# Patient Record
Sex: Male | Born: 1955 | Race: White | Hispanic: No | Marital: Single | Smoking: Never smoker
Health system: Northeastern US, Academic
[De-identification: ages and names within clinical notes are randomized; demographics above are authoritative.]

---

## 2016-03-03 HISTORY — PX: TRANSURETHRAL RESECTION OF PROSTATE: SHX73

## 2016-09-25 LAB — HEMOGLOBIN A1C: HEMOGLOBIN A1C % (INT/EXT): 6.2 % — ABNORMAL HIGH (ref 4.6–5.6)

## 2018-01-20 ENCOUNTER — Ambulatory Visit: Admitting: Urology

## 2018-01-21 LAB — PSA SCREENING (EXT): PSA (EXT): 2.6 ng/mL (ref 0.0–5.0)

## 2018-01-21 LAB — HEMOGLOBIN A1C: HEMOGLOBIN A1C % (INT/EXT): 6.6 % — ABNORMAL HIGH (ref 4.6–5.6)

## 2018-11-01 ENCOUNTER — Inpatient Hospital Stay
Admit: 2018-11-01 | Disposition: A | Discharge status: Home / Self Care | Source: Home / Self Care | Attending: Emergency Medicine | Admitting: Emergency Medicine

## 2018-11-01 ENCOUNTER — Ambulatory Visit: Admitting: Urology

## 2018-11-01 LAB — HX COMPREHENSIVE METABOLIC PANEL
CASE NUMBER: 2020244000209
HX ALBUMIN LVL: 4.2 g/dL — NL (ref 3.2–5.0)
HX ALKALINE PHOSPHATASE: 65 U/L — NL (ref 30.0–117.0)
HX ALT: 26 U/L — NL (ref 6.0–55.0)
HX ANION GAP: 6 — NL (ref 3.0–11.0)
HX AST: 46 U/L — ABNORMAL HIGH (ref 6.0–40.0)
HX BILIRUBIN TOTAL: 0.7 mg/dL — NL (ref 0.2–1.2)
HX BUN: 18 mg/dL — NL (ref 8.0–23.0)
HX CALCIUM LVL: 9 mg/dL — NL (ref 8.5–10.5)
HX CHLORIDE: 106 mmol/L — NL (ref 98.0–110.0)
HX CO2: 24 mmol/L — NL (ref 21.0–32.0)
HX CREATININE: 1.17 mg/dL — NL (ref 0.55–1.3)
HX GLUCOSE LVL: 165 mg/dL — ABNORMAL HIGH (ref 70.0–110.0)
HX POTASSIUM LVL: 5 mmol/L — NL (ref 3.6–5.2)
HX SODIUM LVL: 136 mmol/L — NL (ref 136.0–146.0)
HX TOTAL PROTEIN: 8.2 g/dL — NL (ref 6.0–8.4)

## 2018-11-01 LAB — HX .ABORH CONFIRMATION
CASE NUMBER: 2020244000209
HX ABORH CONFIRM: B POS — NL
HX ANTI-A: 0 — NL

## 2018-11-01 LAB — HX URINE DIPSTICK W/REFLEX
CASE NUMBER: 2020244000255
HX UA BILIRUBIN: NEGATIVE — NL
HX UA GLUCOSE: NEGATIVE — NL
HX UA KETONES: NEGATIVE — NL
HX UA LEUKOCYTE ESTERASE: NEGATIVE — NL
HX UA NITRITE: POSITIVE — AB
HX UA PH: 6 — NL (ref 5.0–8.0)
HX UA PROTEIN: 100 mg/dL — AB
HX UA RBC: >182 — ABNORMAL HIGH (ref 0.0–2.0)
HX UA SPECIFIC GRAVITY: >1.03 — ABNORMAL HIGH (ref 1.003–1.03)
HX UA SQUAMOUS EPITHELIAL: <1 — NL (ref 0.0–5.0)
HX UA UROBILINOGEN: 2 — AB
HX UA WBC: 8 /HPF — ABNORMAL HIGH (ref 0.0–5.0)

## 2018-11-01 LAB — HX CBC W/ DIFF
CASE NUMBER: 2020244000209
HX ABSOLUTE NRBC COUNT: 0 10*3/uL
HX HCT: 39.8 % — NL (ref 39.0–53.0)
HX HGB: 13.7 g/dL — NL (ref 13.0–17.5)
HX MCH: 29.9 pg — NL (ref 26.0–34.0)
HX MCHC: 34.4 g/dL — NL (ref 31.0–37.0)
HX MCV: 86.9 fL — NL (ref 80.0–100.0)
HX MPV: 11.3 fL — NL (ref 9.4–12.4)
HX NRBC PERCENT: 0 % — NL
HX PLATELET: 187 10*3/uL — NL (ref 150.0–400.0)
HX RBC: 4.58 10*6/uL — NL (ref 4.2–5.9)
HX RDW-CV: 13.3 % — NL (ref 11.5–14.5)
HX RDW-SD: 41.9 fL — NL (ref 35.0–51.0)
HX WBC: 10.4 10*3/uL — NL (ref 4.0–11.0)

## 2018-11-01 LAB — HX .AUTOMATED DIFF
CASE NUMBER: 2020244000209
HX ABSOLUTE BASO COUNT: 0.05 10*3/uL — NL (ref 0.0–0.22)
HX ABSOLUTE EOS COUNT: 0.04 10*3/uL — NL (ref 0.0–0.45)
HX ABSOLUTE LYMPHS COUNT: 1.15 10*3/uL — NL (ref 0.74–5.04)
HX ABSOLUTE MONO COUNT: 0.45 10*3/uL — NL (ref 0.0–1.34)
HX ABSOLUTE NEUTRO COUNT: 8.71 10*3/uL — ABNORMAL HIGH (ref 1.48–7.95)
HX BASOPHILS: 0.5 %
HX EOSINOPHILS: 0.4 %
HX IMMATURE GRANULOCYTES: 0.4 % — NL (ref 0.0–2.0)
HX LYMPHOCYTES: 11 %
HX MONOCYTES: 4.3 %
HX NEUTROPHILS: 83.4 %

## 2018-11-01 LAB — HX COVID19 BY PCR (LGH)
CASE NUMBER: 2020244000391
HX COVID19 BY PCR: NOT DETECTED

## 2018-11-01 LAB — HX GLOMERULAR FILTRATION RATE (ESTIMATED)
CASE NUMBER: 2020244000209
HX AFN AMER GLOMERULAR FILTRATION RATE: 75 mL/min/{1.73_m2}
HX NON-AFN AMER GLOMERULAR FILTRATION RATE: 65 mL/min/{1.73_m2}

## 2018-11-01 LAB — HX PTT
CASE NUMBER: 2020244000209
HX APTT: 23 s — NL (ref 23.0–32.0)

## 2018-11-01 LAB — HX PT
CASE NUMBER: 2020244000209
HX INR: 1.1
HX PT: 11.9 s — ABNORMAL HIGH (ref 9.3–11.6)

## 2018-11-01 LAB — HX ANTIBODY SCREEN
CASE NUMBER: 2020244000210
HX ANTIBODY SCREEN AUTOMATED: NEGATIVE — NL

## 2018-11-01 LAB — HX ABO/RH TYPE
CASE NUMBER: 2020244000210
HX ABO/RH TYPE: B POS — NL

## 2018-11-01 NOTE — Discharge Summary (Signed)
 Name :  Tommy Frost, Tommy Frost    DOB :  [id number]    Sex :  Male    MRN :  [id number]    Date of Admission    11/01/2018    Date of Discharge    11/02/2018    Admission History    Code Status    Code Status - Ordered    -- 11/01/18 6:14:00 EDT, Full Resuscitation, Constant Order    Allergies    NKA    Social History    Smoking Status    Unknown if ever smoked    Alcohol    None    Tobacco    Never smoker    Hospital Course    Problem List/Past Medical History    Ongoing    No qualifying data    Historical    No qualifying data    BPH    Hypertension    Hyperlipidemia    Type 2 diabetes        Procedure/Surgical History        * Closure of Skin and Subcutaneous Tissue Other Sites (07/30/2012)    * Other Endoscopy of Small Intestine (03/26/2012)        TURP in 1/16                    #Urinary retention with hematuria    patient had CBI, it was d/c in the am    patient was able to void x 3 prior to discharge    plan for outpatient follow-up with his regular urologist at [facility] clinic.                        CODE STATUS addressed-full code      Procedures and Treatment Provided    Procedure: CT Abdomen and Pelvis C+  11/01/2018 2:56 AM    Indications: Other (Free text in Reason for Exam) - Hematuria.    Comparison: None        Technique: CT examination of the abdomen and pelvis was performed with    intravenous contrast without oral contrast ingestion.  Multiplanar reformats    were created and evaluated.  Dose reduction techniques including automated    exposure control and adjustment of the [place] and/or kV according to patient size    were utilized.        FINDINGS:    Kidneys and Collecting System: No solid renal mass. There is symmetric  bilateral    perinephric edema with likely bilateral delayed nephrogram and bilateral    hydroureteronephrosis extending to the bladder. No hydronephrosis or  identified    calculus.  The bladder is decompressed around a Foley catheter with and    depended air likely related to his  instrumentation. There are heterogeneous    internal contents of the bladder with irregular possible bladder wall    thickening.        Visualized Inferior Thorax: Unremarkable.    Liver: No suspicious focal liver lesion or intrahepatic ductal dilation.  -    Gallbladder and Extrahepatic Biliary Tree: There is no evidence of    cholelithiasis, gallbladder wall thickening, or pericholecystic fluid. Normal    appearance of the extrahepatic biliary tree.    Pancreas: No suspicious mass or pancreatic ductal dilation.    Spleen: Appropriate enhancement without enlargement.    Adrenal Glands: No thickening or focal mass.    Alimentary Tract: No concerning bowel wall thickening, bowel  dilation or    abnormal enhancement.  A normal appendix is seen.  -    Peritoneum, Retroperitoneum, and Abdominal Wall: No peritoneal/mesenteric fat    stranding or soft tissue thickening. No ascites or focal fluid collection. No    intraperitoneal/retroperitoneal free air.  No hernia.    Pelvic Organs: Prostatomegaly.    Lymph Nodes: No suspicious abdominal or pelvis adenopathy.    Vessels: The aorta and its major branches are intact without high-grade    stenosis, occlusion/thrombosis, or dissection.  The portal venous system is    patent.  No venous thrombosis identified.    Bones: No fracture or suspicious osseus lesion.            IMPRESSION:    1\.  Foley catheter in decompressed bladder with internal contents reflecting    hematoma; in this setting, it is difficult to exclude bladder infection or    malignancy.    2\.  Findings of urinary outlet obstruction in the bilateral kidneys and    collecting system, without evident renal or ureteral lesion.    3\.  Prostatomegaly.        Findings were discussed with the patient's clinician, Kenn File, by Dr.    Loni Beckwith on 11/01/2018 3:16 AM.      Physical Exam    Vitals & Measurements    **T: **97.8 ??F (Oral) **TMIN: **97.8 ??F (Oral) **TMAX: **98.0 ??F (Oral)  **HR:  **52(Peripheral) **RR: **18 **BP: **153/77 **SpO2: **95%    General: Alert and oriented, no acute distress    Respiratory: Lungs clear to auscultation, respirations non-labored    Cardiovascular: Normal Rate, normal rhythm    Gastrointestinal: Abdomen soft, non tender    Musculoskeletal: normal ROM, normal strength    Neurologic: Alert, Oriented    Psychiatric: Cooperative, appropriate mood & affect      Lab Results    Blood Glucose POC: 110 mg/dL (48/47/20 72:18:28)    Blood Glucose Testing Reason: Routine capillary blood sugar (11/02/18  11:32:00)    Glucose Lvl: 91 mg/dL (83/37/44 51:46:04)    Glucose Lvl: 87 mg/dL (79/98/72 15:87:27)    BUN: 11 mg/dL (61/84/85 92:76:39)    BUN: 11 mg/dL (43/20/03 79:44:46)    Creatinine: 0.872 mg/dL (19/01/22 24:11:46)    Creatinine: 0.883 mg/dL (43/14/27 67:01:10)    Afn Amer Glomerular Filtration Rate: >90 (11/02/18 05:39:00)    Non-Afn Amer Glomerular Filtration Rate: >90 (11/02/18 05:39:00)    Sodium Lvl: 143 mmol/L (11/02/18 05:39:00)    Sodium Lvl: 143 mmol/L (11/02/18 05:39:00)    Potassium Lvl: 3.8 mmol/L (11/02/18 05:39:00)    Potassium Lvl: 3.7 mmol/L (11/02/18 05:39:00)    Chloride: 109 mmol/L (11/02/18 05:39:00)    Chloride: 109 mmol/L (11/02/18 05:39:00)    CO2: 27 mmol/L (11/02/18 05:39:00)    CO2: 29 mmol/L (11/02/18 05:39:00)    Anion Gap: 7 (11/02/18 05:39:00)    Anion Gap: 5 (11/02/18 05:39:00)    Total Protein: 6.3 Gm/dL (03/49/61 16:43:53)    Albumin Lvl: 3.4 Gm/dL (91/22/58 34:62:19)    Calcium Lvl: 7.9 mg/dL Low (47/12/52 71:29:29)    Calcium Lvl: 7.8 mg/dL Low (11/04/99 49:96:92)    Bilirubin Total: 0.5 mg/dL (49/32/41 99:14:44)    Alkaline Phosphatase: 51 Units/L (11/02/18 05:39:00)    AST: 17 Units/L (11/02/18 05:39:00)    ALT: 23 Units/L (11/02/18 05:39:00)    Hemoglobin A1c: 5.6 % (11/02/18 05:39:00)    Est Average Glucose (eAG): 114 mg/dL (58/48/35 07:57:32)    WBC: 8.5 thous/mm3 (11/02/18  05:39:00)    RBC: 4.06 Mil/mm3 Low (11/02/18 05:39:00)     Hgb: 11.5 Gm/dL Low (22/41/14 64:31:42)    Hct: 36.1 % Low (11/02/18 05:39:00)    Platelet: 172 thous/mm3 (11/02/18 05:39:00)    MCV: 88.9 fL (11/02/18 05:39:00)    MCH: 28.3 pGm (11/02/18 05:39:00)    MCHC: 31.9 Gm/dL (76/70/11 00:34:96)    RDW-SD: 44.7 fL (11/02/18 05:39:00)    MPV: 11.8 fL (11/02/18 05:39:00)    Absolute Neutro Count: 5.53 thous/mm3 (11/02/18 05:39:00)    Absolute Lymphs Count: 2.07 thous/mm3 (11/02/18 05:39:00)    Absolute Mono Count: 0.72 thous/mm3 (11/02/18 05:39:00)    Absolute Eos Count: 0.16 thous/mm3 (11/02/18 05:39:00)    Absolute Baso Count: 0.04 thous/mm3 (11/02/18 05:39:00)    Neutrophils: 64.8 % (11/02/18 05:39:00)    Lymphocytes: 24.2 % (11/02/18 05:39:00)    Monocytes: 8.4 % (11/02/18 05:39:00)    Eosinophils: 1.9 % (11/02/18 05:39:00)    Basophils: 0.5 % (11/02/18 05:39:00)    Immature Granulocytes: 0.2 % (11/02/18 05:39:00)    NRBC Percent: 0 % (11/02/18 05:39:00)    Absolute NRBC Count: 0 thous/mm3 (11/02/18 05:39:00)    Discharge Diagnoses    Hematuria    Inability to void (Complaint of)    Urinary retention    Discharge Medications    _Discharge_    acetaminophen 325 mg oral tablet, 650 mg= 2 tab(s), PO, q4hr, PRN    Levaquin 500 mg oral tablet, 500 mg= 1 tab(s), PO, q24hr    lisinopril, 40 mg, PO, Daily    metformin, 500 mg, PO, BID    omeprazole 20 mg oral delayed release capsule, 20 mg= 1 cap(s), PO, Daily    Ozempic (1 mg dose), 1 mg, sc, qweeksun    pravastatin, 40 mg, PO, Daily    Discharge Instructions    PLEASE FOLLOW UP WITH PCP IN 3 DAYS ([place] PCP)    Follow up with Urology in 2-3 weeks [facility])    DIET AS TOLERATED    ACTIVITY AS TOLERATED            *PLEASE FORWARD D/C SUMMARY TO PCP*    Counseling    Face to Face        FACE TO FACE PATIENT COUNSELLING/COORDINATING CARE MORE THAN 50% OF ENCOUNTER  TIME: YES        TOTAL ENCOUNTER TIME: 35 mins    SIGNATURE LINE Electronically signed by Brynda Peon MD, [place] on 11/05/2018 at  14:36:50 EST

## 2018-11-01 NOTE — Progress Notes (Signed)
 Name :  Tommy Frost, Tommy Frost    DOB :  [id number]    Sex :  Male    MRN :  [id number]    Subjective    pt doing well urine is clear and yellow on a minimal CBI, no major hand  irrigation overnight    Review of Systems    Objective    Vitals & Measurements    **T: **98.0 ??F (Oral) **TMIN: **97.7 ??F (Oral) **TMAX: **98 ??F (Oral) **HR:  **55(Peripheral) **RR: **18 **BP: **154/71 **SpO2: **96%    Physical Exam    Medications    _Inpatient_    acetaminophen tablet, 650 mg= 2 tab(s), PO, q4hr, PRN    Benadryl, 25 mg= 1 cap(s), PO, q8hr, PRN    Dextrose 10% Water (hypoglycemia), 125 mL, IV Piggyback, ud, PRN    Dextrose 10% Water (hypoglycemia), 250 mL, IV Piggyback, ud, PRN    glucagon, 1 mg= 1 EA, IM, ud, PRN    glucose 40% oral gel, 15 Gm= 39 mL, PO, ud, PRN    guaiFENesin, 100 mg= 5 mL, PO, q4hr, PRN    HumaLOG Low, 0-12 Unit(s), sc, QIDACHS    Milk of Magnesia 8% oral suspension, 2400 mg= 30 mL, PO, Daily, PRN    morPHINE, 1 mg= 0.5 mL, IV Push, q4hr, PRN    NaCl 0.9% 1,000 mL, 1000 mL, IV    NaCl 0.9% 1,000 mL, 1000 mL, IV    ondansetron, 4 mg= 2 mL, IV Push, q8hr, PRN    pantoprazole, 40 mg= 1 tab(s), PO, Daily    Pravachol, 40 mg= 2 tab(s), PO, HS    Rocephin    Zestril, 40 mg= 2 tab(s), PO, Daily    Lab Results    No labs resulted in the past 24 hours.    Diagnostic Results    Impression and Plan    Hematuria    would d/c cath and if pt voids well x 2: can d/c home on some po antibiotics  and follow up with his [facility] doc in 2-3 weeks. if he cannot void then he  can go home with a foley cath. will order the cath out now    Inability to void (Complaint of)    Urinary retention    Urinary retention    SIGNATURE LINE Electronically signed by Roselee Nova MD, Curley Hogen E on 11/02/2018 at  06:45:03 EST

## 2018-11-01 NOTE — Progress Notes (Signed)
 Tommy Frost, Tommy Frost **DOB:** January 10, 1956 (63 yo M) **Acc No.** [id number] **DOS:**  11/01/2018    ---      **Progress Notes**    ---    **Patient:** Tommy Frost, AgrusaAccount Number:** [id number]  **Provider:** Elana Alm. Idell Pickles, M.D.,F.A.C.S.     **DOB:** 03/29/1955 **Age:** 63 Y **Sex:** Male  **Date:** 11/01/2018     **Phone:** [phone]     **Address:** [address], [place], [id number]     **Pcp:** Staff Not On        * * *         **Subjective:**        ---      **Chief Complaints:**    ------        ------     **Medical History:**        ------        **Objective:**        ---         **Assessment:**        ---         **Plan:**        ---        ------                ---    Electronically signed by Jeani Hawking on 11/15/2018 at 05:23 PM EDT    Sign off status: Completed          * * *      **Provider:** Molly Maduro A. Idell Pickles, M.D.,F.A.C.S.  **Date:** 11/01/2018    ------

## 2018-11-01 NOTE — H&P (Signed)
 Name :  Tommy Frost, Tommy Frost    DOB :  [id number]    Sex :  Male    MRN :  [id number]    Chief Complaint    urinary retention    History of Present Illness    63 year old male with history of BPH status post TURP in January 2016 with Dr.  Burns [place], hypertension, hyperlipidemia who presents with 1 day of inability to  urinate.        Patient reports at 5 PM he started having lower abdominal/suprapubic pain and  was unable to urinate.  He reports that he was having for the past day and  hematuria along with some clots which is not unusual for him ever since he had  his TURP.  He reports every 6 months or so he has hematuria that sort of self  resolves and is believed to be secondary to his TURP procedure. About 1.5  years ago he had cystoscopy which was unrevealing.  Denies blood in stools or  any nausea vomiting.  Denies any fever chills or shakes to suggest sepsis.  No  flank pain.  He does report about 30 pound weight loss in the past 6 months  which is purely intentional as he has been trying to lose weight.      Review of Systems    All complete 14 review of systems negative except as mentioned above      Code Status    Code Status - Ordered    -- 11/01/18 6:14:00 EDT, Full Resuscitation, Constant Order          Physical Exam    Vitals & Measurements    **T: **97.7 ??F (Oral) **TMIN: **97.7 ??F (Oral) **TMAX: **98.6 ??F (Oral) **HR:  **62(Peripheral) **RR: **18 **BP: **135/71 **SpO2: **97% **WT: **127 Kg    Gen: AAO X3, NAD    HEENT: NC, AT , no elevated JVD    CVS: S1, S2 heard, no m/r/g    Chest: CTA b/l, no w/r/r    KZS:WFUX, NT, ND    Ext: NO c/c/e    neuro:Grossly non focal    Skin:no rashes, no ecchymosis    bACK-- No CVA tenderness        IMPRESSION:    1\.  Foley catheter in decompressed bladder with internal contents reflecting    hematoma; in this setting, it is difficult to exclude bladder infection or    malignancy.    2\.  Findings of urinary outlet obstruction in the bilateral kidneys and     collecting system, without evident renal or ureteral lesion.    3\.  Prostatomegaly.    [1]    Impression and Plan    Hematuria                Inability to void (Complaint of)    Acute urinary retention with bladder scan in the emergency room more than 500  cc.  Status post three-way Foley with continuous bladder irrigation.  Currently still has reddish urine.    Empirically on IV ceftriaxone as he does have history of sepsis from urinary  tract a few years ago.  Currently no signs of sepsis.            Urinary retention    S/p Foley resolved    Appreciate urology evaluation, plan for outpatient follow-up with his regular  urologist at [facility] clinic.  CODE STATUS addressed-full code        total care time spent 62 minutes.    More than 50% of time was done counseling/coordinating care.    Face-to-face patient counseling done              Problem List/Past Medical History    Ongoing    No qualifying data    Historical    No qualifying data    BPH    Hypertension    Hyperlipidemia    Type 2 diabetes      Procedure/Surgical History      * Closure of Skin and Subcutaneous Tissue Other Sites (07/30/2012)    * Other Endoscopy of Small Intestine (03/26/2012)    TURP in 1/16    Social History    Smoking Status    Unknown if ever smoked    Alcohol    None    Tobacco    Never smoker    Patient is independent baseline.  Denies any history of smoking or heavy  alcohol.  Accompanied with his girlfriend by bedside      Family History          Denies family history of cancer      Allergies    NKA    Medications    _Inpatient_    acetaminophen tablet, 650 mg= 2 tab(s), PO, q4hr, PRN    Benadryl, 25 mg= 1 cap(s), PO, q8hr, PRN    guaiFENesin, 100 mg= 5 mL, PO, q4hr, PRN    Milk of Magnesia 8% oral suspension, 2400 mg= 30 mL, PO, Daily, PRN    morPHINE, 1 mg= 0.5 mL, IV Push, q4hr, PRN    NaCl 0.9% 1,000 mL, 1000 mL, IV    NaCl 0.9% 1,000 mL, 1000 mL, IV    ondansetron, 4 mg= 2 mL, IV Push, q8hr,  PRN    pantoprazole, 40 mg= 1 tab(s), PO, Daily    Pravachol, 40 mg= 2 tab(s), PO, HS    Rocephin    Zestril, 40 mg= 2 tab(s), PO, Daily    _Home_    lisinopril, 40 mg, PO, Daily    metformin, 500 mg, PO, BID    omeprazole 20 mg oral delayed release capsule, 20 mg= 1 cap(s), PO, Daily    Ozempic (1 mg dose), 1 mg, sc, qweeksun    pravastatin, 40 mg, PO, Daily    Diet    CHO Consistent Diet - Ordered    -- 11/01/18 13:17:00 EDT, Room Service, Scheduled / PRN, 60gm/Meal          Lab Results          Glucose Lvl: 165 mg/dL High (64/83/03 22:01:99)    BUN: 18 mg/dL (24/15/51 61:44:32)    Creatinine: 1.17 mg/dL (46/99/78 02:08:91)    Afn Amer Glomerular Filtration Rate: 75 ml/min/1.58m2 (11/01/18 01:43:00)    Non-Afn Amer Glomerular Filtration Rate: 65 ml/min/1.70m2 (11/01/18 01:43:00)    Sodium Lvl: 136 mmol/L (11/01/18 01:43:00)    Potassium Lvl: 5 mmol/L (11/01/18 01:43:00)    Chloride: 106 mmol/L (11/01/18 01:43:00)    CO2: 24 mmol/L (11/01/18 01:43:00)    Anion Gap: 6 (11/01/18 01:43:00)    Total Protein: 8.2 Gm/dL (00/26/28 54:96:56)    Albumin Lvl: 4.2 Gm/dL (59/94/37 19:07:07)    Calcium Lvl: 9 mg/dL (21/71/16 54:61:24)    Bilirubin Total: 0.7 mg/dL (32/75/56 23:92:15)    Alkaline Phosphatase: 65 Units/L (11/01/18 01:43:00)    AST: 46 Units/L High (11/01/18 01:43:00)  ALT: 26 Units/L (11/01/18 01:43:00)    WBC: 10.4 thous/mm3 (11/01/18 01:43:00)    RBC: 4.58 Mil/mm3 (11/01/18 01:43:00)    Hgb: 13.7 Gm/dL (08/65/78 46:96:29)    Hct: 39.8 % (11/01/18 01:43:00)    Platelet: 187 thous/mm3 (11/01/18 01:43:00)    MCV: 86.9 fL (11/01/18 01:43:00)    MCH: 29.9 pGm (11/01/18 01:43:00)    MCHC: 34.4 Gm/dL (52/84/13 24:40:10)    RDW-SD: 41.9 fL (11/01/18 01:43:00)    MPV: 11.3 fL (11/01/18 01:43:00)    Absolute Neutro Count: 8.71 thous/mm3 High (11/01/18 01:43:00)    Absolute Lymphs Count: 1.15 thous/mm3 (11/01/18 01:43:00)    Absolute Mono Count: 0.45 thous/mm3 (11/01/18 01:43:00)    Absolute Eos Count: 0.04 thous/mm3  (11/01/18 01:43:00)    Absolute Baso Count: 0.05 thous/mm3 (11/01/18 01:43:00)    Neutrophils: 83.4 % (11/01/18 01:43:00)    Lymphocytes: 11 % (11/01/18 01:43:00)    Monocytes: 4.3 % (11/01/18 01:43:00)    Eosinophils: 0.4 % (11/01/18 01:43:00)    Basophils: 0.5 % (11/01/18 01:43:00)    Immature Granulocytes: 0.4 % (11/01/18 01:43:00)    NRBC Percent: 0 % (11/01/18 01:43:00)    Absolute NRBC Count: 0 thous/mm3 (11/01/18 01:43:00)    UA Color: Amber1 (11/01/18 04:52:00)    UA Clarity: Hazy Abnormal (11/01/18 04:52:00)    UA Specific Gravity: >1.030 High (11/01/18 04:52:00)    UA pH: 6 (11/01/18 04:52:00)    UA Protein: 100 Abnormal (11/01/18 04:52:00)    UA Glucose: Negative1 (11/01/18 04:52:00)    UA Ketones: Negative1 (11/01/18 04:52:00)    UA Bilirubin: Negative1 (11/01/18 04:52:00)    UA Blood: Large Abnormal (11/01/18 04:52:00)    UA Urobilinogen: 2.0 Abnormal (11/01/18 04:52:00)    UA Nitrite: Positive1 Abnormal (11/01/18 04:52:00)    UA Leukocyte Esterase: Negative1 (11/01/18 04:52:00)    UA RBC: >182 High (11/01/18 04:52:00)    UA WBC: 8 /HPF High (11/01/18 04:52:00)    UA Squamous Epithelial: <1 (11/01/18 04:52:00)    UA Bacteria: None1 (11/01/18 04:52:00)    COVID Source: NP Swab (11/01/18 06:36:00)    COVID19 by PCR: Not Detected Panther (11/01/18 06:36:00)    ABO/Rh Type: B POS (11/01/18 02:13:00)    Antibody Screen Automated: Negative (11/01/18 02:13:00)    PT: 11.9 sec High (11/01/18 01:43:00)    INR: 1.1 (11/01/18 01:43:00)    aPTT: 23 sec (11/01/18 01:43:00)    Diagnostic Results                ------        [1] Urinary retention; Patrecia Pace MD, Cleotis Nipper 11/01/2018 03:00 EDT    SIGNATURE LINE Electronically signed by Patsy Lager MD, Lab[place]ford Schnitker on 11/01/2018 at  17:58:54 EST

## 2018-11-01 NOTE — Consults (Signed)
 ____________________________________________________________    CONSULTATION  DATE:  11/01/2018    REASON FOR CONSULTATION:  I have been asked to evaluate Tommy Frost, a  63 year old male, admitted to the hospital with gross hematuria and urinary clot  retention.    HISTORY OF PRESENT ILLNESS:  The patient has already been treated with placement  of a Foley catheter with a continuous irrigating feature, which has now started   to clear his urine nicely.  The patient tells me that this has happened many   times over the years, and he is followed by Dr. Lenn Cal at the [facility].  The patient underwent a transurethral resection of the prostate in   2016.  He tells me that he has been well otherwise.    MEDICATIONS:  At home include:  1.  Lisinopril.  2.  Metformin.  3.  Omeprazole.  4.  Ozempic.  5.  Pravastatin.    LABORATORY DATA:  BUN 18.  Creatinine 1.1.  PT 1.1 with INR of 11.9 and PTT of   23.  Hematocrit 39.8.  Platelet count 187.  White count 10.4.  A urinalysis   demonstrates no bacteria, 8 white cells and greater than 182 red cells per   high-powered field.    PHYSICAL EXAMINATION:  GENERAL:  VITAL SIGNS:  Stable set of vital signs.  He is  afebrile.  GENERAL:  He appears quite comfortable.  ABDOMEN:  Soft and   nontender. GENITALIA:  There is a continuous irrigating Foley catheter in place   with pink urine at a minimal irrigation rate.    IMPRESSION AND PLAN:  My impression is that this is a recurrent issue over many   years for Tommy Frost, and right now he appears to be doing well with bladder  irrigation.  This should continue today and overnight, and we will reevaluate   him in the morning.  If he is improved, then he would like to follow up with his  own urologist, Dr. Lenn Cal, at the [facility].    Thank you for this interesting consultation.    Dictated by:  Lum Keas, M.D.    DD: 11/01/2018 12:46:08  DT: 11/03/2018 07:51:00  RE/jf/wmt  Job #:  [id number]   SIGNATURE LINE    Electronically signed by Idell Pickles MD, Elana Alm on 11/06/2018 at 13:01:30 EST

## 2018-11-01 NOTE — Progress Notes (Signed)
 Name :  Tommy Frost, Tommy Frost    DOB :  [id number]    Sex :  Male    MRN :  [id number]    Chief Complaint    urinary retention    Reason for Consultation    Hematuria, urinary clot retention    Physician Requesting Consult    History of Present Illness    ** Please see full dictated note ** In brief, asked to evaluate this 63 yo  male, admitted with gross hematuria and urinary clot retention. He tells me  that this has happened many times over the years, and is followed by Dr.  Lenn Cal at the [facility] clinic. He had a TURP in 2016. He has been well  lately otherwise.    Review of Systems    Code Status    Code Status - Ordered    -- 11/01/18 6:14:00 EDT, Full Resuscitation, Constant Order    Physical Exam    Vitals & Measurements    **T: **97.7 ??F (Oral) **TMIN: **97.7 ??F (Oral) **TMAX: **98.6 ??F (Oral) **HR:  **52(Peripheral) **RR: **18 **BP: **125/55 **SpO2: **99% **WT: **127 Kg    He appears comfortable. The abdomen is soft and non tender. There is a  continuous irrigating foley in place, with pink tinted urine at minimal  irrigant rate    Impression and Plan    Hematuria    ** See full dictated note ** This is apparently a long standing problem for  him. For now, I would recommend that he should continue with bladder  irrigation tonight, and we will reassess in the AM. Once the urine is  adequately clear, he would like to follow up with his own Urologist at the  [facility] clinic.        Problem List/Past Medical History    Ongoing    No qualifying data    Historical    No qualifying data    Procedure/Surgical History      * Closure of Skin and Subcutaneous Tissue Other Sites (07/30/2012)    * Other Endoscopy of Small Intestine (03/26/2012)    Social History    Smoking Status    Unknown if ever smoked    Alcohol    None    Tobacco    Never smoker    Family History    Allergies    NKA    Medications    _Inpatient_    acetaminophen tablet, 650 mg= 2 tab(s), PO, q4hr, PRN    Benadryl, 25 mg= 1 cap(s), PO,  q8hr, PRN    guaiFENesin, 100 mg= 5 mL, PO, q4hr, PRN    Milk of Magnesia 8% oral suspension, 2400 mg= 30 mL, PO, Daily, PRN    morPHINE, 1 mg= 0.5 mL, IV Push, q4hr, PRN    NaCl 0.9% 1,000 mL, 1000 mL, IV    NaCl 0.9% 1,000 mL, 1000 mL, IV    ondansetron, 4 mg= 2 mL, IV Push, q8hr, PRN    pantoprazole, 40 mg= 1 tab(s), PO, Daily    Pravachol, 40 mg= 2 tab(s), PO, HS    Rocephin    Zestril, 40 mg= 2 tab(s), PO, Daily    _Home_    lisinopril, 40 mg, PO, Daily    metformin, 500 mg, PO, BID    omeprazole 20 mg oral delayed release capsule, 20 mg= 1 cap(s), PO, Daily    Ozempic (1 mg dose), 1 mg, sc, qweeksun    pravastatin, 40 mg, PO, Daily  Diet    No qualifying data available.    Lab Results    Glucose Lvl: 165 mg/dL High (22/02/54 27:06:23)    BUN: 18 mg/dL (76/28/31 51:76:16)    Creatinine: 1.17 mg/dL (07/37/10 62:69:48)    Afn Amer Glomerular Filtration Rate: 75 ml/min/1.42m2 (11/01/18 01:43:00)    Non-Afn Amer Glomerular Filtration Rate: 65 ml/min/1.76m2 (11/01/18 01:43:00)    Sodium Lvl: 136 mmol/L (11/01/18 01:43:00)    Potassium Lvl: 5 mmol/L (11/01/18 01:43:00)    Chloride: 106 mmol/L (11/01/18 01:43:00)    CO2: 24 mmol/L (11/01/18 01:43:00)    Anion Gap: 6 (11/01/18 01:43:00)    Total Protein: 8.2 Gm/dL (54/62/70 35:00:93)    Albumin Lvl: 4.2 Gm/dL (81/82/99 37:16:96)    Calcium Lvl: 9 mg/dL (78/93/81 01:75:10)    Bilirubin Total: 0.7 mg/dL (25/85/27 78:24:23)    Alkaline Phosphatase: 65 Units/L (11/01/18 01:43:00)    AST: 46 Units/L High (11/01/18 01:43:00)    ALT: 26 Units/L (11/01/18 01:43:00)    WBC: 10.4 thous/mm3 (11/01/18 01:43:00)    RBC: 4.58 Mil/mm3 (11/01/18 01:43:00)    Hgb: 13.7 Gm/dL (53/61/44 31:54:00)    Hct: 39.8 % (11/01/18 01:43:00)    Platelet: 187 thous/mm3 (11/01/18 01:43:00)    MCV: 86.9 fL (11/01/18 01:43:00)    MCH: 29.9 pGm (11/01/18 01:43:00)    MCHC: 34.4 Gm/dL (86/76/19 50:93:26)    RDW-SD: 41.9 fL (11/01/18 01:43:00)    MPV: 11.3 fL (11/01/18 01:43:00)    Absolute Neutro  Count: 8.71 thous/mm3 High (11/01/18 01:43:00)    Absolute Lymphs Count: 1.15 thous/mm3 (11/01/18 01:43:00)    Absolute Mono Count: 0.45 thous/mm3 (11/01/18 01:43:00)    Absolute Eos Count: 0.04 thous/mm3 (11/01/18 01:43:00)    Absolute Baso Count: 0.05 thous/mm3 (11/01/18 01:43:00)    Neutrophils: 83.4 % (11/01/18 01:43:00)    Lymphocytes: 11 % (11/01/18 01:43:00)    Monocytes: 4.3 % (11/01/18 01:43:00)    Eosinophils: 0.4 % (11/01/18 01:43:00)    Basophils: 0.5 % (11/01/18 01:43:00)    Immature Granulocytes: 0.4 % (11/01/18 01:43:00)    NRBC Percent: 0 % (11/01/18 01:43:00)    Absolute NRBC Count: 0 thous/mm3 (11/01/18 01:43:00)    UA Color: Amber1 (11/01/18 04:52:00)    UA Clarity: Hazy Abnormal (11/01/18 04:52:00)    UA Specific Gravity: >1.030 High (11/01/18 04:52:00)    UA pH: 6 (11/01/18 04:52:00)    UA Protein: 100 Abnormal (11/01/18 04:52:00)    UA Glucose: Negative1 (11/01/18 04:52:00)    UA Ketones: Negative1 (11/01/18 04:52:00)    UA Bilirubin: Negative1 (11/01/18 04:52:00)    UA Blood: Large Abnormal (11/01/18 04:52:00)    UA Urobilinogen: 2.0 Abnormal (11/01/18 04:52:00)    UA Nitrite: Positive1 Abnormal (11/01/18 04:52:00)    UA Leukocyte Esterase: Negative1 (11/01/18 04:52:00)    UA RBC: >182 High (11/01/18 04:52:00)    UA WBC: 8 /HPF High (11/01/18 04:52:00)    UA Squamous Epithelial: <1 (11/01/18 04:52:00)    UA Bacteria: None1 (11/01/18 04:52:00)    ABO/Rh Type: B POS (11/01/18 02:13:00)    Antibody Screen Automated: Negative (11/01/18 02:13:00)    PT: 11.9 sec High (11/01/18 01:43:00)    INR: 1.1 (11/01/18 01:43:00)    aPTT: 23 sec (11/01/18 01:43:00)    Diagnostic Results        ------        SIGNATURE LINE Electronically signed by Idell Pickles MD, Elana Alm on 11/01/2018  at 13:03:46 EST

## 2018-11-02 ENCOUNTER — Ambulatory Visit: Admitting: Urology

## 2018-11-02 LAB — HX GLOMERULAR FILTRATION RATE (ESTIMATED)
CASE NUMBER: 2020245000009
HX AFN AMER GLOMERULAR FILTRATION RATE: >90
HX NON-AFN AMER GLOMERULAR FILTRATION RATE: >90

## 2018-11-02 LAB — HX .AUTOMATED DIFF
CASE NUMBER: 2020245000009
HX ABSOLUTE BASO COUNT: 0.04 10*3/uL — NL (ref 0.0–0.22)
HX ABSOLUTE EOS COUNT: 0.16 10*3/uL — NL (ref 0.0–0.45)
HX ABSOLUTE LYMPHS COUNT: 2.07 10*3/uL — NL (ref 0.74–5.04)
HX ABSOLUTE MONO COUNT: 0.72 10*3/uL — NL (ref 0.0–1.34)
HX ABSOLUTE NEUTRO COUNT: 5.53 10*3/uL — NL (ref 1.48–7.95)
HX BASOPHILS: 0.5 %
HX EOSINOPHILS: 1.9 %
HX IMMATURE GRANULOCYTES: 0.2 % — NL (ref 0.0–2.0)
HX LYMPHOCYTES: 24.2 %
HX MONOCYTES: 8.4 %
HX NEUTROPHILS: 64.8 %

## 2018-11-02 LAB — HX CBC W/ DIFF
CASE NUMBER: 2020245000009
HX ABSOLUTE NRBC COUNT: 0 10*3/uL
HX HCT: 36.1 % — ABNORMAL LOW (ref 39.0–53.0)
HX HGB: 11.5 g/dL — ABNORMAL LOW (ref 13.0–17.5)
HX MCH: 28.3 pg — NL (ref 26.0–34.0)
HX MCHC: 31.9 g/dL — NL (ref 31.0–37.0)
HX MCV: 88.9 fL — NL (ref 80.0–100.0)
HX MPV: 11.8 fL — NL (ref 9.4–12.4)
HX NRBC PERCENT: 0 % — NL
HX PLATELET: 172 10*3/uL — NL (ref 150.0–400.0)
HX RBC: 4.06 10*6/uL — ABNORMAL LOW (ref 4.2–5.9)
HX RDW-CV: 13.6 % — NL (ref 11.5–14.5)
HX RDW-SD: 44.7 fL — NL (ref 35.0–51.0)
HX WBC: 8.5 10*3/uL — NL (ref 4.0–11.0)

## 2018-11-02 LAB — HX BASIC METABOLIC PANEL
CASE NUMBER: 2020245000009
HX ANION GAP: 5 — NL (ref 3.0–11.0)
HX BUN: 11 mg/dL — NL (ref 8.0–23.0)
HX CALCIUM LVL: 7.8 mg/dL — ABNORMAL LOW (ref 8.5–10.5)
HX CHLORIDE: 109 mmol/L — NL (ref 98.0–110.0)
HX CO2: 29 mmol/L — NL (ref 21.0–32.0)
HX CREATININE: 0.883 mg/dL — NL (ref 0.55–1.3)
HX GLUCOSE LVL: 87 mg/dL — NL (ref 70.0–110.0)
HX POTASSIUM LVL: 3.7 mmol/L — NL (ref 3.6–5.2)
HX SODIUM LVL: 143 mmol/L — NL (ref 136.0–146.0)

## 2018-11-02 LAB — HX COMPREHENSIVE METABOLIC PANEL
CASE NUMBER: 2020245000359
HX ALBUMIN LVL: 3.4 g/dL — NL (ref 3.2–5.0)
HX ALKALINE PHOSPHATASE: 51 U/L — NL (ref 30.0–117.0)
HX ALT: 23 U/L — NL (ref 6.0–55.0)
HX ANION GAP: 7 — NL (ref 3.0–11.0)
HX AST: 17 U/L — NL (ref 6.0–40.0)
HX BILIRUBIN TOTAL: 0.5 mg/dL — NL (ref 0.2–1.2)
HX BUN: 11 mg/dL — NL (ref 8.0–23.0)
HX CALCIUM LVL: 7.9 mg/dL — ABNORMAL LOW (ref 8.5–10.5)
HX CHLORIDE: 109 mmol/L — NL (ref 98.0–110.0)
HX CO2: 27 mmol/L — NL (ref 21.0–32.0)
HX CREATININE: 0.872 mg/dL — NL (ref 0.55–1.3)
HX GLUCOSE LVL: 91 mg/dL — NL (ref 70.0–110.0)
HX POTASSIUM LVL: 3.8 mmol/L — NL (ref 3.6–5.2)
HX SODIUM LVL: 143 mmol/L — NL (ref 136.0–146.0)
HX TOTAL PROTEIN: 6.3 g/dL — NL (ref 6.0–8.4)

## 2018-11-02 LAB — HX HEMOGLOBIN A1C
CASE NUMBER: 2020245000359
HX EST AVERAGE GLUCOSE (EAG): 114 mg/dL
HX HBF (INTERNAL): 0.8 % — NL
HX HEMOGLOBIN A1C: 5.6 % — NL
HX LA1C (INTERNAL): 2.1 % — NL
HX P3 PEAK (INTERNAL): 6.1 % — NL
HX TOTAL AREA RANGE (INTERNAL): 110180 microvolt/sec — NL (ref 50000.0–350000.0)

## 2018-11-02 LAB — HX URINE CULTURE
CASE NUMBER: 2020244000211
HX F: NO GROWTH
HX P: NO GROWTH

## 2018-11-02 NOTE — Progress Notes (Signed)
 KHRIZ, LIDDY **DOB:** 16-Aug-1955 (63 yo M) **Acc No.** [id number] **DOS:**  11/02/2018    ---      **Progress Notes**    ---    **Patient:** Tommy Frost, FalwellAccount Number:** [id number]  **Provider:** Adah Salvage. Roselee Nova, M.D.     **DOB:** [birth year] **Age:** 63 Y **Sex:** Male  **Date:** 11/02/2018     **Phone:** [phone]     **Address:** [address], [place], [place]     **Pcp:** Staff Not On        * * *         **Subjective:**        ---      **Chief Complaints:**    ------        ------     **Medical History:**        ------        **Objective:**        ---         **Assessment:**        ---         **Plan:**        ---        ------                ---    Electronically signed by Jeani Hawking on 11/15/2018 at 05:23 PM EDT    Sign off status: Completed          * * *      **Provider:** Kimball Manske E. Roselee Nova, M.D.  **Date:** 11/02/2018    ------

## 2018-11-10 ENCOUNTER — Ambulatory Visit

## 2018-11-22 ENCOUNTER — Ambulatory Visit: Admitting: Urology

## 2018-11-22 NOTE — Progress Notes (Signed)
* * *      Tommy Frost, Tommy Frost **DOB:** 12-Dec-1955 (63 yo M) **Acc No.** [id number] **DOS:**  11/22/2018    ---       Tommy Frost**    ------    69 Y old Male, DOB: 05/01/1955    [name], [place], [name], [place] [address]    Home: [phone]    Provider: Grace Isaac        * * *    Telephone Encounter    ---    Answered by  Stephanie Coup Date: 11/22/2018       Time: 10:57 AM    Caller  PT    ------            Reason  Needs call back from office            Message                     PT ws seen at [facility] w retention and was told to follow up here                 Action Taken                     [facility]  11/22/2018 10:57:34 AM >      Morales,Lorenys  11/22/2018 2:21:26 PM > info in docs      [place],Lynne  11/22/2018 3:13:36 PM > pt should f/u with his urologist at [facility] per RE      [facility]  11/22/2018 4:08:29 PM > left vm for pt letting him know                    * * *                ---          * * *         Provider: Grace Isaac 11/22/2018    ---    Note generated by eClinicalWorks EMR/PM Software ([email or web address])

## 2019-03-04 HISTORY — PX: SKIN CANCER EXCISION: SHX779

## 2019-03-21 ENCOUNTER — Ambulatory Visit

## 2019-03-21 ENCOUNTER — Inpatient Hospital Stay
Admit: 2019-03-21 | Disposition: A | Discharge status: Home / Self Care | Source: Home / Self Care | Attending: Family | Admitting: Family

## 2019-03-21 ENCOUNTER — Ambulatory Visit: Admitting: Urology

## 2019-03-21 LAB — HX COMPREHENSIVE METABOLIC PANEL
CASE NUMBER: 2021018000257
HX ALBUMIN LVL: 4.7 g/dL — NL (ref 3.2–5.0)
HX ALKALINE PHOSPHATASE: 73 U/L — NL (ref 30.0–117.0)
HX ALT: 33 U/L — NL (ref 6.0–55.0)
HX ANION GAP: 7 — NL (ref 3.0–11.0)
HX AST: 21 U/L — NL (ref 6.0–40.0)
HX BILIRUBIN TOTAL: 0.6 mg/dL — NL (ref 0.2–1.2)
HX BUN: 21 mg/dL — NL (ref 8.0–23.0)
HX CALCIUM LVL: 9 mg/dL — NL (ref 8.5–10.5)
HX CHLORIDE: 104 mmol/L — NL (ref 98.0–110.0)
HX CO2: 27 mmol/L — NL (ref 21.0–32.0)
HX CREATININE: 1.09 mg/dL — NL (ref 0.55–1.3)
HX GLUCOSE LVL: 122 mg/dL — ABNORMAL HIGH (ref 70.0–110.0)
HX POTASSIUM LVL: 3.9 mmol/L — NL (ref 3.6–5.2)
HX SODIUM LVL: 138 mmol/L — NL (ref 136.0–146.0)
HX TOTAL PROTEIN: 8.4 g/dL — NL (ref 6.0–8.4)

## 2019-03-21 LAB — HX GLOMERULAR FILTRATION RATE (ESTIMATED)
CASE NUMBER: 2021018000257
HX AFN AMER GLOMERULAR FILTRATION RATE: 82 mL/min/{1.73_m2}
HX NON-AFN AMER GLOMERULAR FILTRATION RATE: 71 mL/min/{1.73_m2}

## 2019-03-21 LAB — HX .AUTOMATED DIFF
CASE NUMBER: 2021018000257
HX ABSOLUTE BASO COUNT: 0.06 10*3/uL — NL (ref 0.0–0.22)
HX ABSOLUTE EOS COUNT: 0.11 10*3/uL — NL (ref 0.0–0.45)
HX ABSOLUTE LYMPHS COUNT: 1.83 10*3/uL — NL (ref 0.74–5.04)
HX ABSOLUTE MONO COUNT: 0.8 10*3/uL — NL (ref 0.0–1.34)
HX ABSOLUTE NEUTRO COUNT: 7.08 10*3/uL — NL (ref 1.48–7.95)
HX BASOPHILS: 0.6 %
HX EOSINOPHILS: 1.1 %
HX IMMATURE GRANULOCYTES: 0.2 % — NL (ref 0.0–2.0)
HX LYMPHOCYTES: 18.5 %
HX MONOCYTES: 8.1 %
HX NEUTROPHILS: 71.5 %

## 2019-03-21 LAB — HX BLUE TOP TO HOLD: CASE NUMBER: 2021018000257

## 2019-03-21 LAB — HX CBC W/ DIFF
CASE NUMBER: 2021018000257
HX ABSOLUTE NRBC COUNT: 0 10*3/uL
HX HCT: 44.7 % — NL (ref 39.0–53.0)
HX HGB: 14.9 g/dL — NL (ref 13.0–17.5)
HX MCH: 28.1 pg — NL (ref 26.0–34.0)
HX MCHC: 33.3 g/dL — NL (ref 31.0–37.0)
HX MCV: 84.2 fL — NL (ref 80.0–100.0)
HX MPV: 10.7 fL — NL (ref 9.4–12.4)
HX NRBC PERCENT: 0 % — NL
HX PLATELET: 241 10*3/uL — NL (ref 150.0–400.0)
HX RBC: 5.31 10*6/uL — NL (ref 4.2–5.9)
HX RDW-CV: 13.8 % — NL (ref 11.5–14.5)
HX RDW-SD: 42.5 fL — NL (ref 35.0–51.0)
HX WBC: 9.9 10*3/uL — NL (ref 4.0–11.0)

## 2019-03-21 LAB — HX URINE MICROSCOPIC ONLY (OUTPATIENT USE)
CASE NUMBER: 2021018000256
CASE NUMBER: 2021018000347
HX UA RBC: >182 — ABNORMAL HIGH (ref 0.0–2.0)
HX UA RBC: >182 — ABNORMAL HIGH (ref 0.0–2.0)
HX UA SQUAMOUS EPITHELIAL: <1 — NL (ref 0.0–5.0)
HX UA SQUAMOUS EPITHELIAL: <1 — NL (ref 0.0–5.0)
HX UA WBC: >182 — ABNORMAL HIGH (ref 0.0–5.0)
HX UA WBC: 9 /HPF — ABNORMAL HIGH (ref 0.0–5.0)

## 2019-03-21 LAB — HX COVID19 BY PCR (LGH)
CASE NUMBER: 2021018000485
HX COVID19 BY PCR: NOT DETECTED

## 2019-03-21 LAB — HX SST GOLD TUBE TO HOLD: CASE NUMBER: 2021018000257

## 2019-03-21 NOTE — Progress Notes (Signed)
 Name :  Tommy Frost, Tommy Frost    DOB :  [id number]    Sex :  Male    MRN :  [id number]    Subjective    pt doing much better urine is clear and yellow and flowing nicely on a low CBI     Current Weight    ---    Weight: 124.738 Kg (03/21/19 00:08:00)        Intake and Output     This visit (24 hour periods starting at 07:00 EST)    ------     03/21/19 *  03/20/19  03/19/19    ------------    Total Summary     ------    Intake mL  3  1,000  \--    ------------    Output mL  4,250  \--  \--    ------------    Fluid Balance   -4,247  1,000  \--    ------------    Intake (2)     ------    Sodium Chloride 0.9% 1,000 mL mL  \--  1,000  \--    ------------    sodium chloride mL  3  \--  \--    ------------    Total  3  1,000  \--    ------------    Output (2)     ------    Residual Amount mL  3,000  \--  \--    ------------    True Urine Output mL  1,250  \--  \--    ------------    Total  4,250  \--  \--    ------------    Counts (4)     ------    Irrigant Intake mL  16,500  \--  \--    ------------    Irrigant/Urine Output mL  14,250  \--  \--    ------------    Residual Amount mL  3,000  \--  \--    ------------    True Urine Output mL  1,250  \--  \--    ------------        * This column has not completed the indicated time period.    Objective    Vitals & Measurements    **T: **97.9 ??F (Oral) **HR: **82(Peripheral) **RR: **18 **BP: **168/59 **SpO2:  **97%    **HT: **177.8 cm **WT: **124.738 Kg **BMI: **39.46    Impression and Plan    Hematuria, Hematuria    Will feed today, make NPO p MN in case he goes back into clot retention, start  finasteride and if clear in AM: d/c foley.    urinary blockage (Complaint of)    Urinary retention, Urinary retention    finasteride, Dose: 5 mg = 1 tab(s), Tab, PO, Daily, First Dose Date/Time:  03/21/19 14:23:00 EST    Sodium Chloride 0.9% 1,000 mL, Dose: 1,000 mL, Soln, IV, Routine, 125  ml/hr, 8  hr, 1,000 ml (TOTAL VOLUME), First dose date/time: 03/21/19 11:19:00 EST    Bladder irrigation    CHO Consistent Diet    NPO Pending    Lab Results    Blood Glucose POC: 126 mg/dL High (62/83/66 29:47:65)    Glucose Lvl: 122 mg/dL High (46/50/35 46:56:81)    BUN: 21 mg/dL (27/51/70 01:74:94)    Creatinine: 1.09 mg/dL (49/67/59 16:38:46)    Afn Amer Glomerular Filtration Rate: 82 ml/min/1.57m2 (03/21/19 00:31:00)    Non-Afn Amer Glomerular Filtration Rate: 71 ml/min/1.16m2 (03/21/19 00:31:00)    Sodium Lvl:  138 mmol/L (03/21/19 00:31:00)    Potassium Lvl: 3.9 mmol/L (03/21/19 00:31:00)    Chloride: 104 mmol/L (03/21/19 00:31:00)    CO2: 27 mmol/L (03/21/19 00:31:00)    Anion Gap: 7 (03/21/19 00:31:00)    Total Protein: 8.4 Gm/dL (16/10/96 04:54:09)    Albumin Lvl: 4.7 Gm/dL (81/19/14 78:29:56)    Calcium Lvl: 9 mg/dL (21/30/86 57:84:69)    Bilirubin Total: 0.6 mg/dL (62/95/28 41:32:44)    Alkaline Phosphatase: 73 Units/L (03/21/19 00:31:00)    AST: 21 Units/L (03/21/19 00:31:00)    ALT: 33 Units/L (03/21/19 00:31:00)    WBC: 9.9 thous/mm3 (03/21/19 00:31:00)    RBC: 5.31 Mil/mm3 (03/21/19 00:31:00)    Hgb: 14.9 Gm/dL (03/04/70 53:66:44)    Hct: 44.7 % (03/21/19 00:31:00)    Platelet: 241 thous/mm3 (03/21/19 00:31:00)    MCV: 84.2 fL (03/21/19 00:31:00)    MCH: 28.1 pGm (03/21/19 00:31:00)    MCHC: 33.3 Gm/dL (03/47/42 59:56:38)    RDW-SD: 42.5 fL (03/21/19 00:31:00)    MPV: 10.7 fL (03/21/19 00:31:00)    Absolute Neutro Count: 7.08 thous/mm3 (03/21/19 00:31:00)    Absolute Lymphs Count: 1.83 thous/mm3 (03/21/19 00:31:00)    Absolute Mono Count: 0.8 thous/mm3 (03/21/19 00:31:00)    Absolute Eos Count: 0.11 thous/mm3 (03/21/19 00:31:00)    Absolute Baso Count: 0.06 thous/mm3 (03/21/19 00:31:00)    Neutrophils: 71.5 % (03/21/19 00:31:00)    Lymphocytes: 18.5 % (03/21/19 00:31:00)    Monocytes: 8.1 % (03/21/19 00:31:00)    Eosinophils: 1.1 % (03/21/19 00:31:00)    Basophils: 0.6 % (03/21/19 00:31:00)     Immature Granulocytes: 0.2 % (03/21/19 00:31:00)    NRBC Percent: 0 % (03/21/19 00:31:00)    Absolute NRBC Count: 0 thous/mm3 (03/21/19 00:31:00)    UA RBC: >182 High (03/21/19 03:36:00)    UA WBC: 9 /HPF High (03/21/19 03:36:00)    UA Squamous Epithelial: <1 (03/21/19 03:36:00)    UA Bacteria: None1 (03/21/19 03:36:00)    COVID Source: NP Swab (03/21/19 05:14:00)    COVID19 by PCR: Not Detected Panther (03/21/19 05:14:00)    Blue Top Tube To Hold: DONE (03/21/19 00:31:00)    Diagnostic Results    Medications    acetaminophen 325 mg oral tablet, 650 mg= 2 tab(s), PO, q4hr, PRN    acetaminophen 325 mg oral tablet, 650 mg= 2 tab(s), PO, q4hr, PRN    bolus NS 1,000 mL, 1000 mL, IV    Dextrose 10% Water (hypoglycemia), 125 mL, IV Piggyback, ud, PRN    Dextrose 10% Water (hypoglycemia), 250 mL, IV Piggyback, ud, PRN    Dextrose 50% For Protocol, per protocol, IV Push, ud, PRN    finasteride, 5 mg= 1 tab(s), PO, Daily    glucagon, 1 mg= 1 EA, IM, ud, PRN    glucose 40% oral gel, 15 Gm= 39 mL, PO, ud, PRN    HumaLOG Low, 0-12 Unit(s), sc, QIDACHS    lisinopril, 40 mg= 2 tab(s), PO, Daily    lisinopril, 40 mg, PO, Daily    metformin, 500 mg, PO, BID    NaCl 0.9% 1,000 mL, 1000 mL, IV    normal saline 1,000 mL, 1000 mL, IV    omeprazole 20 mg oral delayed release capsule, 20 mg= 1 cap(s), PO, Daily    Ozempic (1 mg dose), 1 mg, sc, qweeksun    pantoprazole, 40 mg= 1 tab(s), PO, Daily    pravastatin, 40 mg= 2 tab(s), PO, Daily    pravastatin, 40 mg, PO, Daily  Sodium Chloride 0.9% Flush, 3 mL, Flush, q8hr    tetanus/diphth/pertuss (Tdap) adult/adol, 0.5 mL, IM, 8amDaily        SIGNATURE LINE Electronically signed by Roselee Nova MD, Kaydynce Pat E on 03/21/2019 at  14:25:21 EST

## 2019-03-21 NOTE — Progress Notes (Signed)
 ACHERON, SUGG **DOB:** [birth year] (64 yo M) **Acc No.** [id number] **DOS:**  03/21/2019    ---      **Progress Notes**    ---    **Patient:** Tommy, EppsAccount Number:** [id number]  **Provider:** Adah Salvage. Roselee Nova, M.D.     **DOB:** [birth year] **Age:** 64 Y **Sex:** Male  **Date:** 03/21/2019     **Phone:** [phone]     **Address:** [address], [place], [place]     **Pcp:** Venita Sheffield        * * *         **Subjective:**        ---      **Chief Complaints:**    ------        ------     **Medical History:**        ------        **Objective:**        ---         **Assessment:**        ---         **Plan:**        ---        ------                ---    Electronically signed by Jeani Hawking on 03/25/2019 at 10:11 AM EST    Sign off status: Completed          * * *      **Provider:** Jamall Strohmeier E. Roselee Nova, M.D.  **Date:** 03/21/2019    ------

## 2019-03-21 NOTE — Progress Notes (Signed)
 Name :  Tommy Frost, Tommy Frost    DOB :  [id number]    Sex :  Male    MRN :  [id number]    Subjective    He is feeling fine this AM. The urine is clear yellow on minimal irrigant. No  other issues noted.    Objective    Vitals & Measurements    **T: **97.6 ??F (Oral) **HR: **58(Peripheral) **RR: **18 **BP: **151/69 **SpO2:  **98%    **HT: **177.8 cm **WT: **124.738 Kg **BMI: **39.46    Appears well, comfortable.    Impression and Plan    His hematuria seems to have resolved.    Recommend:    1\. I have ordered the removal of the foley    2. He should stay on finasteride indefinitely    3\. He should complete an empiric abx course orally for one week    4. Following discharge, he will need a follow up visit with Dr. Roselee Nova in one - two weeks. Please arrange prior to discharge.    5\. We will sign off for now.        Medications    _Inpatient_    acetaminophen 325 mg oral tablet, 650 mg= 2 tab(s), PO, q4hr, PRN    bolus NS 1,000 mL, 1000 mL, IV    Dextrose 10% Water (hypoglycemia), 125 mL, IV Piggyback, ud, PRN    Dextrose 10% Water (hypoglycemia), 250 mL, IV Piggyback, ud, PRN    Dextrose 50% For Protocol, per protocol, IV Push, ud, PRN    finasteride, 5 mg= 1 tab(s), PO, Daily    glucagon, 1 mg= 1 EA, IM, ud, PRN    glucose 40% oral gel, 15 Gm= 39 mL, PO, ud, PRN    HumaLOG Low, 0-12 Unit(s), sc, QIDACHS    lisinopril, 40 mg= 2 tab(s), PO, Daily    NaCl 0.9% 1,000 mL, 1000 mL, IV    normal saline 1,000 mL, 1000 mL, IV    pantoprazole, 40 mg= 1 tab(s), PO, Daily    pravastatin, 40 mg= 2 tab(s), PO, Daily    Sodium Chloride 0.9% Flush, 3 mL, Flush, q8hr    tetanus/diphth/pertuss (Tdap) adult/adol, 0.5 mL, IM, 8amDaily    Lab Results    Blood Glucose POC: 95 mg/dL (03/40/35 24:81:85)    Diagnostic Results        ------        SIGNATURE LINE Electronically signed by Idell Pickles MD, Elana Alm on 03/22/2019  at 07:04:40 EST

## 2019-03-21 NOTE — Consults (Signed)
 ____________________________________________________________    CONSULTATION  DATE:  03/21/2019    HISTORY OF PRESENT ILLNESS:  The patient is a 64 year old male who has a   urologist at [facility], Dr. Merton Border Mourtzinos who underwent a TURP in 2016.  He came  into the Emergency Room on 11/01/2018 and gross hematuria.  A   3-way catheter   was placed.  He was observed overnight.  Foley catheter came out and he did   okay.  He has since followed up with Dr. Hart Rochester which according to the   patient, he has done a couple of cystoscopies and could not find any episodes of  bleeding, which he says has had a few times.    He now comes back into our   Emergency Room with complete clot retention.  A three-way Foley catheter was   placed and it was grabbed by the nurse on the floor seeing it was not working.    I just went in and I just spent a half hour hand irrigating out clots.  It   appears to be relatively clot free and appears to be flowing slowly and nicely   at this time.  He denies being on any blood thinners any other major medical   issues.  He is a nonsmoker.  He does have diabetes and high blood pressure and   GERD and is on appropriate medications.  1.  Metformin.  2.  Lisinopril.  3.  Omeprazole.    For that, he is also on Pravastatin for cholesterol.    LABORATORY DATA:  His BUN and creatinine of 21 and 1.09.  Urinalysis showed   mainly red cells.  There were initial white cells but no bacteria.  Is red   cells, white cells, but no bacteria.  Repeat showed mainly red cells.  He   underwent a CT scan of his abdomen and pelvis way back in 11/01/2018, which   basically showed normal kidneys at that time.  No evidence of any stones,   masses, etc.  A Foley catheter and decompressed bladder and some prostatomegaly.   He has not been on finasteride for years.    PHYSICAL EXAMINATION:  ABDOMEN:  On exam, right now after irrigating out all of   the clots.  His abdomen is soft and nondistended.  GENERAL:  Right now is  in no acute distress.  GENITALIA:  His external genitalia  are normal with Foley catheter in place.    IMPRESSION AND PLAN:  The patient is coming in with clot retention.  Apparently,  this happened several times.  He does not have a good answer for it.  I do not   have his records at [facility].  I just hand irrigated him out, so he is relatively   clear.  I will keep him n.p.o. and watch him with some IV fluids and some low   irrigation for the next few hours.  If this clots off again, we may need to take  him to the Operating Room urgently to do a cystoscopy and clot evacuation.  If   not and if this remains clear, then I will feed him and then tomorrow morning    we might be able to get the catheter out see how he does.  He says he voids with  no problem.  At this point, I have also strongly recommended that although he   can follow up with Dr. Hart Rochester, he needs to speak to him about these  recurrent episodes of bleeding or see by myself in the office.  We will follow   him while he is in-house.    Dictated by:  Adah Salvage Roselee Nova, M.D.    DD: 03/21/2019 11:24:47  DT: 03/21/2019 11:54:00  REA/tam  Job #: [id number]   SIGNATURE LINE    Electronically signed by Roselee Nova MD, Adah Salvage on 03/21/2019 at 12:23:22 EST

## 2019-03-21 NOTE — Progress Notes (Signed)
 Name :  Tommy Frost, Tommy Frost    DOB :  [id number]    Sex :  Male    MRN :  [id number]    Chief Complaint    Hematuria, Urinary Blockage    Reason for Consultation    Physician Requesting Consult    History of Present Illness    please see fully dictated consultation: pt in clot retention and GU hx as  documented in my consult: I hand irrigated him for 30 mins and it appears  clear for now    Review of Systems    Code Status    Code Status - Ordered    -- 03/21/19 7:12:00 EST, Full Resuscitation, Constant Order    Physical Exam    Vitals & Measurements    **T: **97.9 ??F (Oral) **TMIN: **97.6 ??F (Oral) **TMAX: **97.9 ??F (Oral) **HR:  **82(Peripheral) **RR: **18 **BP: **168/59 **SpO2: **97% **WT: **124.738 Kg    Impression and Plan    Hematuria, Hematuria    will observe the pt on slow CBI and start NS IVF and keep NPO for a few hours  a re-evaluate him then. Will hold off on an upper tract study. given his Hx:  probably prostatic bleeding    urinary blockage (Complaint of)    Urinary retention, Urinary retention    Sodium Chloride 0.9% 1,000 mL, Dose: 1,000 mL, Soln, IV, Routine, 125 ml/hr, 8  hr, 1,000 ml (TOTAL VOLUME), First dose date/time: 03/21/19 11:19:00 EST    Bladder irrigation    NPO        Problem List/Past Medical History    Ongoing    Fall risk    Historical    No qualifying data    Procedure/Surgical History      * Closure of Skin and Subcutaneous Tissue Other Sites (07/30/2012)    * Other Endoscopy of Small Intestine (03/26/2012)    Social History    Smoking Status    Unknown if ever smoked    Alcohol    None    Tobacco    Never smoker    Family History    Allergies    NKA    Medications    _Inpatient_    acetaminophen 325 mg oral tablet, 650 mg= 2 tab(s), PO, q4hr, PRN    bolus NS 1,000 mL, 1000 mL, IV    Dextrose 10% Water (hypoglycemia), 125 mL, IV Piggyback, ud, PRN    Dextrose 10% Water (hypoglycemia), 250 mL, IV Piggyback, ud, PRN    Dextrose 50% For Protocol, per protocol, IV Push, ud,  PRN    glucagon, 1 mg= 1 EA, IM, ud, PRN    glucose 40% oral gel, 15 Gm= 39 mL, PO, ud, PRN    HumaLOG Low, 0-12 Unit(s), sc, QIDACHS    lisinopril, 40 mg= 2 tab(s), PO, Daily    NaCl 0.9% 1,000 mL, 1000 mL, IV    normal saline 1,000 mL, 1000 mL, IV    pantoprazole, 40 mg= 1 tab(s), PO, Daily    pravastatin, 40 mg= 2 tab(s), PO, Daily    Sodium Chloride 0.9% Flush, 3 mL, Flush, q8hr    tetanus/diphth/pertuss (Tdap) adult/adol, 0.5 mL, IM, 8amDaily    _Home_    acetaminophen 325 mg oral tablet, 650 mg= 2 tab(s), PO, q4hr, PRN    lisinopril, 40 mg, PO, Daily    metformin, 500 mg, PO, BID    omeprazole 20 mg oral delayed release capsule, 20 mg= 1 cap(s), PO, Daily  Ozempic (1 mg dose), 1 mg, sc, qweeksun    pravastatin, 40 mg, PO, Daily    Diet    NPO - Ordered    -- 03/21/19 11:20:00 EST, 03/21/19 11:20:00 EST    Lab Results    Blood Glucose POC: 126 mg/dL High (66/81/59 47:07:61)    Glucose Lvl: 122 mg/dL High (51/83/43 73:57:89)    BUN: 21 mg/dL (78/47/84 12:82:08)    Creatinine: 1.09 mg/dL (13/88/71 95:97:47)    Afn Amer Glomerular Filtration Rate: 82 ml/min/1.23m2 (03/21/19 00:31:00)    Non-Afn Amer Glomerular Filtration Rate: 71 ml/min/1.37m2 (03/21/19 00:31:00)    Sodium Lvl: 138 mmol/L (03/21/19 00:31:00)    Potassium Lvl: 3.9 mmol/L (03/21/19 00:31:00)    Chloride: 104 mmol/L (03/21/19 00:31:00)    CO2: 27 mmol/L (03/21/19 00:31:00)    Anion Gap: 7 (03/21/19 00:31:00)    Total Protein: 8.4 Gm/dL (18/55/01 58:68:25)    Albumin Lvl: 4.7 Gm/dL (74/93/55 21:74:71)    Calcium Lvl: 9 mg/dL (59/53/96 72:89:79)    Bilirubin Total: 0.6 mg/dL (15/04/13 64:38:37)    Alkaline Phosphatase: 73 Units/L (03/21/19 00:31:00)    AST: 21 Units/L (03/21/19 00:31:00)    ALT: 33 Units/L (03/21/19 00:31:00)    WBC: 9.9 thous/mm3 (03/21/19 00:31:00)    RBC: 5.31 Mil/mm3 (03/21/19 00:31:00)    Hgb: 14.9 Gm/dL (79/39/68 86:48:47)    Hct: 44.7 % (03/21/19 00:31:00)    Platelet: 241 thous/mm3 (03/21/19 00:31:00)    MCV: 84.2 fL  (03/21/19 00:31:00)    MCH: 28.1 pGm (03/21/19 00:31:00)    MCHC: 33.3 Gm/dL (20/72/18 28:83:37)    RDW-SD: 42.5 fL (03/21/19 00:31:00)    MPV: 10.7 fL (03/21/19 00:31:00)    Absolute Neutro Count: 7.08 thous/mm3 (03/21/19 00:31:00)    Absolute Lymphs Count: 1.83 thous/mm3 (03/21/19 00:31:00)    Absolute Mono Count: 0.8 thous/mm3 (03/21/19 00:31:00)    Absolute Eos Count: 0.11 thous/mm3 (03/21/19 00:31:00)    Absolute Baso Count: 0.06 thous/mm3 (03/21/19 00:31:00)    Neutrophils: 71.5 % (03/21/19 00:31:00)    Lymphocytes: 18.5 % (03/21/19 00:31:00)    Monocytes: 8.1 % (03/21/19 00:31:00)    Eosinophils: 1.1 % (03/21/19 00:31:00)    Basophils: 0.6 % (03/21/19 00:31:00)    Immature Granulocytes: 0.2 % (03/21/19 00:31:00)    NRBC Percent: 0 % (03/21/19 00:31:00)    Absolute NRBC Count: 0 thous/mm3 (03/21/19 00:31:00)    UA RBC: >182 High (03/21/19 03:36:00)    UA WBC: 9 /HPF High (03/21/19 03:36:00)    UA Squamous Epithelial: <1 (03/21/19 03:36:00)    UA Bacteria: None1 (03/21/19 03:36:00)    Blue Top Tube To Hold: DONE (03/21/19 00:31:00)    Diagnostic Results        ------        SIGNATURE LINE Electronically signed by Roselee Nova MD, Bettejane Leavens E on 03/21/2019 at  11:26:48 EST

## 2019-03-21 NOTE — Discharge Summary (Signed)
Name :  Tommy Frost, Tommy Frost    DOB :  [id number]    Sex :  Male    MRN :  [id number]    Date of Admission    03/21/2019    Date of Discharge    03/22/2019    Admission History    Chief Complaint    Hematuria, Urinary Blockage    History of Present Illness        64 year old gentleman with history TURP 2016 - presented to the ER with  hematuria. Bladder irrigation initiated in fact patient has no other  complaint, he has no chest pain, no shortness of breath, no lower limb edema,  alert, oriented his H&H is also stable.        Past medical and surgical history    BPH, status post TURP, NIDDM, hypertension        Medications, reviewed.        Allergies, please refer to the EMR.        Family and social history, not an  active smoker, no alcohol use  no mention  premature creatinine artery disease in the family.              Code Status    Code Status - Ordered    -- 03/21/19 7:12:00 EST, Full Resuscitation, Constant Order    Allergies    NKA    Social History    Smoking Status    Unknown if ever smoked    Alcohol    None    Tobacco    Never smoker    Hospital Course    #Hematuria    status post TURP in the past, urinary retention    evaluated by urology    bladder irrigation was initiated    foley was d/c in the am, patient able to void    dc on abx x 7 days and finasteride upon discharge    urology appt has been scheduled            #NIDDM    hold metformin    SSI    CHO diet        #Hyperlipidemia on statins        #Hypertension on lisinopril.        DVT prophylaxis, on hold due to hematuria        GI ppx , PPI.        CODE STATUS, full code    Procedures and Treatment Provided    Physical Exam    Vitals & Measurements    **T: **97.6 F (Oral) **TMIN: **97.6 F (Oral) **TMAX: **98.0 F (Oral) **HR:  **58(Peripheral) **RR: **18 **BP: **151/69 **SpO2: **98%    General: Alert and oriented, no acute distress    Respiratory: respirations non-labored    Cardiovascular: Normal Rate, normal rhythm    Gastrointestinal:  Abdomen soft, non tender    Musculoskeletal: normal ROM, normal strength    Neurologic: Alert, Oriented    Psychiatric: Cooperative, appropriate mood & affect      Lab Results    Blood Glucose POC: 105 mg/dL (56/43/32 95:18:84)    Glucose Lvl: 83 mg/dL (16/60/63 01:60:10)    BUN: 13 mg/dL (93/23/55 73:22:02)    Creatinine: 0.833 mg/dL (54/27/06 23:76:28)    Afn Amer Glomerular Filtration Rate: >90 (03/22/19 06:33:00)    Non-Afn Amer Glomerular Filtration Rate: >90 (03/22/19 06:33:00)    Sodium Lvl: 143 mmol/L (03/22/19 06:33:00)    Potassium Lvl: 4.1 mmol/L (03/22/19 06:33:00)  Chloride: 110 mmol/L (03/22/19 06:33:00)    CO2: 29 mmol/L (03/22/19 06:33:00)    Anion Gap: 4 (03/22/19 06:33:00)    Calcium Lvl: 8.3 mg/dL Low (41/32/44 01:02:72)    WBC: 8.6 thous/mm3 (03/22/19 06:33:00)    RBC: 4.36 Mil/mm3 (03/22/19 06:33:00)    Hgb: 12.4 Gm/dL Low (53/66/44 03:47:42)    Hct: 38.4 % Low (03/22/19 06:33:00)    Platelet: 187 thous/mm3 (03/22/19 06:33:00)    MCV: 88.1 fL (03/22/19 06:33:00)    MCH: 28.4 pGm (03/22/19 06:33:00)    MCHC: 32.3 Gm/dL (59/56/38 75:64:33)    RDW-SD: 45.7 fL (03/22/19 06:33:00)    MPV: 11.1 fL (03/22/19 06:33:00)    NRBC Percent: 0 % (03/22/19 06:33:00)    Absolute NRBC Count: 0 thous/mm3 (03/22/19 06:33:00)    PT: 11.9 sec High (03/22/19 06:33:00)    INR: 1.1 (03/22/19 06:33:00)    aPTT: 26 sec (03/22/19 06:33:00)    Discharge Diagnoses    Hematuria, Hematuria    Urinary retention, Urinary retention    Discharge Medications    _Discharge_    acetaminophen 325 mg oral tablet, 650 mg= 2 tab(s), PO, q4hr, PRN    finasteride 5 mg oral tablet, 5 mg= 1 tab(s), PO, Daily    Levaquin 500 mg oral tablet, 500 mg= 1 tab(s), PO, q24hr    lisinopril, 40 mg, PO, Daily    metformin, 500 mg, PO, BID    omeprazole 20 mg oral delayed release capsule, 20 mg= 1 cap(s), PO, Daily    Ozempic (1 mg dose), 1 mg, sc, qweeksun    pravastatin, 40 mg, PO, Daily    Discharge Instructions    Urology appt: 04/01/2019 at 3PM     PLEASE FOLLOW UP WITH PCP IN 3-5 DAYS    Follow up with Repeat CBC CMP LFTs in 3-5 days    CHO Diet    ACTIVITY AS TOLERATED            *PLEASE FORWARD D/C SUMMARY TO PCP*    Counseling    Face to Face        FACE TO FACE PATIENT COUNSELLING/COORDINATING CARE MORE THAN 50% OF ENCOUNTER  TIME: YES        TOTAL ENCOUNTER TIME: 35 mins    SIGNATURE LINE Electronically signed by Brynda Peon MD, Seema on 03/22/2019 at  21:02:28 EST

## 2019-03-21 NOTE — H&P (Signed)
 Name :  Tommy Frost, Tommy Frost    DOB :  [id number]    Sex :  Male    MRN :  [id number]    Chief Complaint    Hematuria, Urinary Blockage    History of Present Illness        64 year old gentleman with history TURP 2016 - presented to the ER with  hematuria. Bladder irrigation initiated in fact patient has no other  complaint, he has no chest pain, no shortness of breath, no lower limb edema,  alert, oriented his H&H is also stable.        Past medical and surgical history    BPH, status post TURP, NIDDM, hypertension        Medications, reviewed.        Allergies, please refer to the EMR.        Family and social history, not an  active smoker, no alcohol use  no mention  premature creatinine artery disease in the family.              Review of Systems    10 point review of system is negative except per history of present illness    Code Status    Code Status - Ordered    -- 03/21/19 7:12:00 EST, Full Resuscitation, Constant Order    Physical Exam    Vitals & Measurements    **T: **97.9 F (Oral) **TMIN: **97.6 F (Oral) **TMAX: **97.9 F (Oral) **HR:  **82(Peripheral) **RR: **18 **BP: **168/59 **SpO2: **97% **WT: **124.738 Kg    General: Alert and oriented, no acute distress    Eye: PERRL, Normal Conjunctiva    HEENT: Normocephalic, no damage to dentition, TM's clear, good light reflex,  normal hearing, moist oral mucosa, no pharyngeal erythema, ear canals patent,  no sinus tenderness    Neck: supple, nontender, carotid pulse WNL, no carotid bruit, no JVD, no  lymphadenopathy, no thyromegaly, full ROM    Respiratory: Lungs clear to auscultation, respirations non-labored, breath  sounds equal and regular, symmetrical chest expansion, no chest wall  tenderness    Cardiovascular: Normal Rate, normal rhythm,_ beats/minute, no gallop, good  pulses equal in all extremities, normal peripheral perfusion, no edema.    Gastrointestinal: Abdomen soft, non tender, non-distended, normal bowel sounds  all four quadrants, no  organomegaly    Musculoskeletal: normal ROM, normal strength, no tenderness, no swelling, no  deformity, normal gait    Integumentary: Skin intact, warm, pink, dry. No pallor, no rash.    Neurologic: Alert, Oriented    Cognition and Speech: Oriented, speech clear and coherent    Psychiatric: Cooperative, appropriate mood & affect    Impression and Plan    Hematuria, Hematuria    Admit Patient To    urinary blockage (Complaint of)    Urinary retention, Urinary retention    acetaminophen, Dose: 650 mg = 2 tab(s), Tab, PO, q4hr, PRN, Pain,  Breakthrough, First Dose Date/Time: 03/21/19 8:35:00 EST    Dextrose 10% in Water intravenous solution, Dose: 250 mL, Soln, IV Piggyback,  ud, PRN, Blood Glucose, 1000 ml/hr, 250 ml (TOTAL VOLUME), First Dose  Date/Time: 03/21/19 8:36:00 EST    Dextrose 10% in Water intravenous solution, Dose: 125 mL, Soln, IV Piggyback,  ud, PRN, Blood Glucose, 500 ml/hr, 125 ml (TOTAL VOLUME), First Dose  Date/Time: 03/21/19 8:36:00 EST    glucagon, Dose: 1 mg = 1 EA, Injection, IM, ud, PRN, Blood Glucose, First Dose  Date/Time: 03/21/19 8:36:00 EST, For SEVERE hypoglycemia  if IV access is not  available.    glucose, per protocol, Soln, IV Push, ud, PRN, Blood Glucose, Start Date/Time:  03/21/19 8:36:00 EST    glucose, Dose: 15 Gm = 39 mL, Gel, PO, ud, PRN, Blood Glucose, First Dose  Date/Time: 03/21/19 8:36:00 EST    insulin lispro, Dose: 0-12 Unit(s), Injection, sc, QIDACHS, First Dose  Date/Time: 03/21/19 11:30:00 EST, LOW SCALE    lisinopril, Dose: 40 mg = 2 tab(s), Tab, PO, Daily, First Dose Date/Time:  03/21/19 9:00:00 EST    pantoprazole, Dose: 40 mg = 1 tab(s), EC Tablet, PO, Daily, First Dose  Date/Time: 03/21/19 8:36:00 EST    pravastatin, Dose: 40 mg = 2 tab(s), Tab, PO, Daily, First Dose Date/Time:  03/21/19 8:35:00 EST    sodium chloride, Dose: 3 mL, Injection, Flush, q8hr, First Dose Date/Time:  03/21/19 9:00:00 EST    Basic Metabolic Panel    Blood Glucose Monitoring POC    CBC w/  Indices    Intake and Output    Peripheral IV Care    PT    PTT    Saline Lock    Sequential Compression Device    Urinalysis (Complete)    Vital Signs        #Hematuria status post TURP in the past, urinary retention, evaluated by  urology, bladder irrigation. Alert, oriented, stable H&H.    #NIDDM, hold metformin, SSI for now.    #Hyperlipidemia on statins    #Hypertension on lisinopril.        DVT prophylaxis, on hold due to hematuria    GI ppx , PPI.    CODE STATUS, full code    Face-to-face patient counseling multiple 50% of the time: Total time 60  minutes        Problem List/Past Medical History    Ongoing    Fall risk    Historical    No qualifying data    Procedure/Surgical History      * Closure of Skin and Subcutaneous Tissue Other Sites (07/30/2012)    * Other Endoscopy of Small Intestine (03/26/2012)    Social History    Smoking Status    Unknown if ever smoked    Alcohol    None    Tobacco    Never smoker    Family History    Allergies    NKA    Medications    _Inpatient_    acetaminophen 325 mg oral tablet, 650 mg= 2 tab(s), PO, q4hr, PRN    bolus NS 1,000 mL, 1000 mL, IV    Dextrose 10% Water (hypoglycemia), 125 mL, IV Piggyback, ud, PRN    Dextrose 10% Water (hypoglycemia), 250 mL, IV Piggyback, ud, PRN    Dextrose 50% For Protocol, per protocol, IV Push, ud, PRN    glucagon, 1 mg= 1 EA, IM, ud, PRN    glucose 40% oral gel, 15 Gm= 39 mL, PO, ud, PRN    HumaLOG Low, 0-12 Unit(s), sc, QIDACHS    lisinopril, 40 mg= 2 tab(s), PO, Daily    NaCl 0.9% 1,000 mL, 1000 mL, IV    normal saline 1,000 mL, 1000 mL, IV    pantoprazole, 40 mg= 1 tab(s), PO, Daily    pravastatin, 40 mg= 2 tab(s), PO, Daily    Sodium Chloride 0.9% Flush, 3 mL, Flush, q8hr    tetanus/diphth/pertuss (Tdap) adult/adol, 0.5 mL, IM, 8amDaily    _Home_    acetaminophen 325 mg oral tablet, 650 mg= 2 tab(s),  PO, q4hr, PRN    lisinopril, 40 mg, PO, Daily    metformin, 500 mg, PO, BID    omeprazole 20 mg oral delayed release capsule, 20 mg= 1  cap(s), PO, Daily    Ozempic (1 mg dose), 1 mg, sc, qweeksun    pravastatin, 40 mg, PO, Daily    Diet    NPO - Ordered    -- 03/21/19 11:20:00 EST, 03/21/19 11:20:00 EST    Lab Results    Blood Glucose POC: 126 mg/dL High (16/10/96 04:54:09)    Glucose Lvl: 122 mg/dL High (81/19/14 78:29:56)    BUN: 21 mg/dL (21/30/86 57:84:69)    Creatinine: 1.09 mg/dL (62/95/28 41:32:44)    Afn Amer Glomerular Filtration Rate: 82 ml/min/1.2m2 (03/21/19 00:31:00)    Non-Afn Amer Glomerular Filtration Rate: 71 ml/min/1.67m2 (03/21/19 00:31:00)    Sodium Lvl: 138 mmol/L (03/21/19 00:31:00)    Potassium Lvl: 3.9 mmol/L (03/21/19 00:31:00)    Chloride: 104 mmol/L (03/21/19 00:31:00)    CO2: 27 mmol/L (03/21/19 00:31:00)    Anion Gap: 7 (03/21/19 00:31:00)    Total Protein: 8.4 Gm/dL (03/04/70 53:66:44)    Albumin Lvl: 4.7 Gm/dL (03/47/42 59:56:38)    Calcium Lvl: 9 mg/dL (75/64/33 29:51:88)    Bilirubin Total: 0.6 mg/dL (41/66/06 30:16:01)    Alkaline Phosphatase: 73 Units/L (03/21/19 00:31:00)    AST: 21 Units/L (03/21/19 00:31:00)    ALT: 33 Units/L (03/21/19 00:31:00)    WBC: 9.9 thous/mm3 (03/21/19 00:31:00)    RBC: 5.31 Mil/mm3 (03/21/19 00:31:00)    Hgb: 14.9 Gm/dL (09/32/35 57:32:20)    Hct: 44.7 % (03/21/19 00:31:00)    Platelet: 241 thous/mm3 (03/21/19 00:31:00)    MCV: 84.2 fL (03/21/19 00:31:00)    MCH: 28.1 pGm (03/21/19 00:31:00)    MCHC: 33.3 Gm/dL (25/42/70 62:37:62)    RDW-SD: 42.5 fL (03/21/19 00:31:00)    MPV: 10.7 fL (03/21/19 00:31:00)    Absolute Neutro Count: 7.08 thous/mm3 (03/21/19 00:31:00)    Absolute Lymphs Count: 1.83 thous/mm3 (03/21/19 00:31:00)    Absolute Mono Count: 0.8 thous/mm3 (03/21/19 00:31:00)    Absolute Eos Count: 0.11 thous/mm3 (03/21/19 00:31:00)    Absolute Baso Count: 0.06 thous/mm3 (03/21/19 00:31:00)    Neutrophils: 71.5 % (03/21/19 00:31:00)    Lymphocytes: 18.5 % (03/21/19 00:31:00)    Monocytes: 8.1 % (03/21/19 00:31:00)    Eosinophils: 1.1 % (03/21/19 00:31:00)    Basophils: 0.6 %  (03/21/19 00:31:00)    Immature Granulocytes: 0.2 % (03/21/19 00:31:00)    NRBC Percent: 0 % (03/21/19 00:31:00)    Absolute NRBC Count: 0 thous/mm3 (03/21/19 00:31:00)    UA RBC: >182 High (03/21/19 03:36:00)    UA WBC: 9 /HPF High (03/21/19 03:36:00)    UA Squamous Epithelial: <1 (03/21/19 03:36:00)    UA Bacteria: None1 (03/21/19 03:36:00)    COVID Source: NP Swab (03/21/19 05:14:00)    COVID19 by PCR: Not Detected Panther (03/21/19 05:14:00)    Blue Top Tube To Hold: DONE (03/21/19 00:31:00)    Diagnostic Results        ------        Lenox Ponds LINE Electronically signed by Layla Barter MD, Idabell Picking on 03/21/2019 at  14:09:12 EST

## 2019-03-22 ENCOUNTER — Ambulatory Visit: Admitting: Urology

## 2019-03-22 LAB — HX PTT
CASE NUMBER: 2021019000483
HX APTT: 26 s — NL (ref 23.0–32.0)

## 2019-03-22 LAB — HX BASIC METABOLIC PANEL
CASE NUMBER: 2021019000483
HX ANION GAP: 4 — NL (ref 3.0–11.0)
HX BUN: 13 mg/dL — NL (ref 8.0–23.0)
HX CALCIUM LVL: 8.3 mg/dL — ABNORMAL LOW (ref 8.5–10.5)
HX CHLORIDE: 110 mmol/L — NL (ref 98.0–110.0)
HX CO2: 29 mmol/L — NL (ref 21.0–32.0)
HX CREATININE: 0.833 mg/dL — NL (ref 0.55–1.3)
HX GLUCOSE LVL: 83 mg/dL — NL (ref 70.0–110.0)
HX POTASSIUM LVL: 4.1 mmol/L — NL (ref 3.6–5.2)
HX SODIUM LVL: 143 mmol/L — NL (ref 136.0–146.0)

## 2019-03-22 LAB — HX CBC W/ INDICES
CASE NUMBER: 2021019000483
HX ABSOLUTE NRBC COUNT: 0 10*3/uL
HX HCT: 38.4 % — ABNORMAL LOW (ref 39.0–53.0)
HX HGB: 12.4 g/dL — ABNORMAL LOW (ref 13.0–17.5)
HX MCH: 28.4 pg — NL (ref 26.0–34.0)
HX MCHC: 32.3 g/dL — NL (ref 31.0–37.0)
HX MCV: 88.1 fL — NL (ref 80.0–100.0)
HX MPV: 11.1 fL — NL (ref 9.4–12.4)
HX NRBC PERCENT: 0 % — NL
HX PLATELET: 187 10*3/uL — NL (ref 150.0–400.0)
HX RBC: 4.36 10*6/uL — NL (ref 4.2–5.9)
HX RDW-CV: 14.2 % — NL (ref 11.5–14.5)
HX RDW-SD: 45.7 fL — NL (ref 35.0–51.0)
HX WBC: 8.6 10*3/uL — NL (ref 4.0–11.0)

## 2019-03-22 LAB — HX GLOMERULAR FILTRATION RATE (ESTIMATED)
CASE NUMBER: 2021019000483
HX AFN AMER GLOMERULAR FILTRATION RATE: >90
HX NON-AFN AMER GLOMERULAR FILTRATION RATE: >90

## 2019-03-22 LAB — HX PT
CASE NUMBER: 2021019000483
HX INR: 1.1
HX PT: 11.9 s — ABNORMAL HIGH (ref 9.3–11.6)

## 2019-03-22 LAB — HX URINE CULTURE
CASE NUMBER: 2021018000261
HX F: NO GROWTH

## 2019-03-22 NOTE — Progress Notes (Signed)
 CHANZ, CAHALL **DOB:** Feb 11, 1956 (64 yo M) **Acc No.** [id number] **DOS:**  03/22/2019    ---      **Progress Notes**    ---    **Patient:** Tommy Frost, [name] Number:** [id number]  **Provider:** Elana Alm. Idell Pickles, M.D.,F.A.C.S.     **DOB:** Jul 31, 1955 **Age:** 64 Y **Sex:** Male  **Date:** 03/22/2019     **Phone:** [phone]     **Address:** [address], [place], [id number]     **Pcp:** Venita Sheffield        * * *         **Subjective:**        ---      **Chief Complaints:**    ------        ------     **Medical History:**        ------        **Objective:**        ---         **Assessment:**        ---         **Plan:**        ---        ------                ---    Electronically signed by Jeani Hawking on 03/25/2019 at 10:09 AM EST    Sign off status: Completed          * * *      **Provider:** Molly Maduro A. Idell Pickles, M.D.,F.A.C.S.  **Date:** 03/22/2019    ------

## 2019-03-22 NOTE — Progress Notes (Signed)
* * *      Tommy, Frost **DOB:** [birth year] (64 yo M) **Acc No.** [id number] **DOS:**  03/22/2019    ---       [name]**    ------    58 Y old Male, DOB: 1955/11/19    [name], [place], [name], [place] [address]    Home: [phone]    Provider: Aletta Edouard        * * *    Telephone Encounter    ---    Answered by  Jeannine Kitten Date: 03/22/2019       Time: 04:48 PM    Caller  pt    ------            Reason  Gross Hematuria            Message                     Pt called wants to speak with Dr. Roselee Nova only, he is calling because he just urinated now and he had gross hematuria, he is worried  please advise [phone]                Action Taken                     [name],Aprile  03/22/2019 4:48:05 PM >      [name],[name]  03/22/2019 4:53:03 PM > please advise, pt only wanting to speak to you. has OV with you on 1/26      [name] 03/22/2019 5:02:58 PM > I called him and he is voiding well but had a little dark urine a couple of times when he got home and was getting anxious> I tild him to hydrate well and hopefully this should clear but if he goes back inot AUR or if this gets worse then he needs to go to the ER and at this point would probably need a more urgeny cystoscopy.                     * * *                ---          * * *         Provider: Aletta Edouard 03/22/2019    ---    Note generated by e[employer]Works EMR/PM Software ([email or web address])

## 2019-03-23 ENCOUNTER — Ambulatory Visit: Admitting: Urology

## 2019-03-23 NOTE — Progress Notes (Signed)
* * *      Tommy Frost, Tommy Frost **DOB:** 08/11/1955 (64 yo M) **Acc No.** [id number] **DOS:**  03/23/2019    ---       [name]**    ------    8 Y old Male, DOB: [birth year]    [name], [name], [name], [name]    Home: [phone]    Provider: Aletta Edouard        * * *    Telephone Encounter    ---    Answered by  Aletta Edouard Date: 03/23/2019       Time: 12:38 PM    Reason  fyi    ------            Action Taken                     [name] E 03/23/2019 12:38:46 PM > please see my tel enocunter from late yesterday: can we call him to seee if he is better.. thanks      [name],[name]  03/23/2019 1:12:22 PM > spoke to patient he said that he noticed some blood this morning in urine and 1 small clot- but he is feeling much better. he said he is passing urine freely, flow is good, slept good last night without urgency.                    * * *                ---          * * *         Provider: Aletta Edouard 03/23/2019    ---    Note generated by [employer] EMR/PM Software ([email or web address])

## 2019-03-24 ENCOUNTER — Ambulatory Visit (HOSPITAL_BASED_OUTPATIENT_CLINIC_OR_DEPARTMENT_OTHER): Admitting: Urology

## 2019-04-01 ENCOUNTER — Ambulatory Visit: Admitting: Urology

## 2019-04-01 HISTORY — PX: CYSTOSCOPY: SHX3020

## 2019-04-01 NOTE — Progress Notes (Signed)
 * * *    Tommy Frost, Tommy Frost **DOB:** 1955/06/01 (64 yo M) **Acc No.** [id number] **DOS:**  04/01/2019    ---       Tommy Frost**    ------    64 Y old Male, DOB: 02-16-1956    Account Number: [id number]    [address], [place], [place]    Home: [phone]    Guarantor: Oluwafemi, Villella Insurance: Medicare Payer ID: [id number]    PCP: Venita Sheffield    Appointment Facility: [facility]        * * *    04/01/2019 Progress Notes: Adah Salvage. Roselee Nova, M.D.    ------    ---       **Current Medications**    ---    Taking    * Lisinopril 40 MG Tablet 1 tablet Orally Once a day    ---    * Omeprazole 20 MG Capsule Delayed Release 1 capsule 30 minutes before morning meal Orally Once a day    ---    * Finasteride 5 MG Tablet 1 tablet Orally Once a day    ---    * Metformin HCl 500 MG Tablet 1 tablet with a meal Orally twice a day    ---    * Pravastatin Sodium 40 MG Tablet 1 tablet Orally Once a day    ---    * Medication List reviewed and reconciled with the patient    ---     Past Medical History    ---      Benign prostatic hyperplasia (BPH).        ---    Diabetes.        ---    Hypertension.        ---    GERD.        ---    Hypercholesterolemia.        ---      **Surgical History**    ---      TURP at [facility] 2016    ---    tonsillectomy    ---     **Family History**    ---      Father: alive 102 yrs, prostate cancer, diagnosed with Unspecified essential  hypertension    ---    Mother: deceased 66 yrs, COPD    ---    Children: alive    ---    Siblings: alive, Unspecified essential hypertension    ---    1 brother(s) . 1 son(s) , 1 daughter(s) - healthy.    ---     **Social History**    ---    Alcohol: yes, social.    no Smoking status: never smoker, Patient counseled on the dangers of tobacco  use: 04/01/2019.    Occupation: retired.    no Recreational drug use.    Caffeine: yes, frequency:, 1-2 cups/day, coffee.     **Allergies**    ---      N.K.D.A.    ---       **Hospitalization/Major Diagnostic Procedure**    ---      clot retention 7/31-8/12020    ---    clot retention 03/2019    ---      **Review of Systems**    ---     _General_ :    no Fatigue. no flank pain. no Fever. no Chills. no Weakness. no Abdominal  pain.    _ENT_ :    no Epistaxis.  no Ringing in ears. no Sinus pain. no Blurred vision.    _Endocrine_ :    no Excessive thirst. no dry mouth.    _Cardiovascular_ :    no Chest pain. no Palpitations.    _Pulmonary_ :    no cough. no shortness of breath.    _HematologyLymph_ :    no Easy bruising. no Swollen glands.    _Gastroenterology_ :    no Blood in stool. no Constipation. no Diarrhea. no Heartburn.    _Male Reproductive_ :    Erectile dysfunction yes. low sex drive yes.    _Musculoskeletal_ :    no Back pain. no Joint pain. no bone pain.    _Integumentary_ :    no Persistent itching. no Skin rash.    _Urology_ :    no Dysuria. no Blood in urine. no Frequent urination. no Urinary incontinence.    _Neurology_ :    no Headache. no Insomnia. no Memory loss. no Neuropathy.    _Psychology_ :    no Anxiety. no Depression. no High stress level.          **Reason for Appointment**    ---      1\. Hematuria    ---    2\. BPH    ---      **History of Present Illness**    ---     _Urological History_ :    pt with a hx of BPH and s/p a TURP at [facility] about 4 years ago and has had a  couple of recent episodes of severe clot retention that required  hospitalization at [facility] and hand irrigation by an MUA doc: pt is voiding; still  with a little blood for a few days: good flow, good control, a few clots  before it cleared: he agrees to a cystosocpy today.      **Vital Signs**    ---    Ht **5'9"** , Wt **287** , BMI **42.38** , BP **130/78** , Temp **98.6**.      **Assessments**    ---    1\. Gross hematuria - R31.0 (Primary)    ---    2\. Benign prostatic hyperplasia with lower urinary tract symptoms, symptom  details unspecified - N40.1    ---    3\. 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 Essential hypertension - I10    ---    5\. Hypercholesteremia - E78.00    ---    6\. Gastroesophageal reflux disease, unspecified whether esophagitis present -  K21.9    ---      **Treatment**    ---      **1\. Gross hematuria**    Notes: this appears to be from his prostate and inflammation and although I  cannot 100% rule out a bladder tumor given the bladder neck area: this is most  likely the cause of his bleeding. We discussed about doing another TURP vs  just keeping him on the finasteride and see how he does and for now he wants  to wait until I get his previous [facility] records and go over them. He knows to call  me if he gets more hematuria and if so he may then need another TURP.  Certainly if he gets hospitalized in clot retention then he will need this  done.    ---        **2\. Benign prostatic hyperplasia with lower urinary tract symptoms, symptom  details unspecified**  Notes: as above.     **Procedures**    ---     _Cystourethroscopy_ :    Indication Hematuria, Benign Prostatic Hyperplasia (BPH). Procedure Informed  consent was obtained, Patient was prepped and draped, Cystourethroscopy done  under sterile precautions, A 15 F flexible cystoscope was used., The urethra  was anesthetized with the instillation of 2% plain lidocaine gel. Bladder no  obvious tumros but at the ant bladder enck inflammed tissue ? prostate falling  into the bladder ( not obviously a bladder tumor) some areas of increased  vascularity due to recent catheterl grade 2 trabeculationl no stones, .  Urethra no inflammation, no stricture, normal. Trigone ureteral orifices seen  with clear efflux, Normal. prostate bilateral lateral lobe hypertrophy with  bladder neck area as previously described above.         **Therapeutic Injections**    ---      Lidocaine Urethral Gel 2% : 5 mL (Dose No:1) (Route: Other/Miscellaneous)  given by Leola Brazil , MD on intravesical    ---       **Labs**     ------    _Lab: ClinitekStatus SG_    ---       Value Reference Range    ---------    Color yellow   \-    GLU Negative   \-    ------------    BIL Negative   \-    ------------    KET Negative   \-    ------------    SG 1.020   \-    ------------    BLO Trace-intact   \-    ------------    pH 5.5   \-    ------------    PRO Negative   \-    ------------    URO 0.2 E.U./dL   \-    ------------    NIT Negative   \-    ------------    LEU Negative   \-    ------------       Kara Pacer 04/01/2019 3:02:38 PM > Jennylee Uehara E 04/01/2019  3:06:36 PM >    ------    _Lab: Urine Microscopy_       Value Reference Range    ---------    WBC 0-1     RBC 0-1     ------------    Epithelial 0     ------------    Bacteria 0     ------------    Amorphous trace     ------------    Mucous 0     ------------    Casts 0     ------------    Other 0     ------------       Bridgeman,Jeanette 04/01/2019 3:12:28 PM > Gabrianna Fassnacht E 04/01/2019  3:15:19 PM >    ------      Procedure Orders:    ------    _Procedure: BLADDER SCAN_    ---       [name],Somchay 04/01/2019 3:10:34 PM > PVR approx 83cc    ------      **Procedure Codes**    ---      81003 UA Automated    ---    81015 MICROSCOPIC EXAM OF URINE    ---    84132 BLADDER SCAN    ---    J2001 Lidocaine injection    ---    52000 CYSTO    ---      **Follow Up**    ---    routine  OV, 3 Months    Electronically signed by Leola Brazil , MD on 04/02/2019 at 11:36 AM EST    Sign off status: Completed        * * [facility]    [address]    [place], [name] [id number]    Tel: [phone]    Fax: [phone]              * * *          Progress Note: Adah Salvage. Roselee Nova, M.D. 04/01/2019    ---    Note generated by eClinicalWorks EMR/PM Software ([email or web address])

## 2019-04-21 ENCOUNTER — Ambulatory Visit: Admitting: Urology

## 2019-04-21 NOTE — Progress Notes (Signed)
* * *      Tommy Frost, Tommy Frost **DOB:** 10-04-1955 (64 yo M) **Acc No.** [id number] **DOS:**  04/21/2019    ---       Tommy Frost**    ------    59 Y old Male, DOB: [birth year]    [name], [place], [name], [place] [address]    Home: [phone]    Provider: Aletta Edouard        * * *    Telephone Encounter    ---    Answered by  Stephanie Coup Date: 04/21/2019       Time: 08:30 AM    Caller  PT    ------            Reason  finasteride refilled            Message                     PT needs refill on Finasteride                 Action Taken                     [facility]  04/21/2019 8:30:38 AM >      [name],[name]  04/21/2019 9:39:15 AM > finasteride refilled                 Refills Refill Finasteride Tablet, 5 MG, Orally, 30, 1 tablet, Once a day, 30  day(s), Refills=5    ------          * * *                ---          * * *         Provider: Aletta Edouard 04/21/2019    ---    Note generated by eClinicalWorks EMR/PM Software ([email or web address])

## 2019-05-10 ENCOUNTER — Ambulatory Visit: Admitting: Specialist

## 2019-05-17 ENCOUNTER — Ambulatory Visit: Admitting: Urology

## 2019-05-17 NOTE — Progress Notes (Signed)
* * *      Tommy Frost, Tommy Frost **DOB:** 1955/06/03 (64 yo M) **Acc No.** [id number] **DOS:**  05/17/2019    ---       Tommy Frost**    ------    32 Y old Male, DOB: [birth year]    [name], [place], [name], [place] [address]    Home: [phone]    Provider: Aletta Edouard        * * *    Telephone Encounter    ---    Answered by  [name] Date: 05/17/2019       Time: 09:51 AM    Caller  self    ------            Reason  ready to schedule Turp            Message                     Patient had his 1st covid vaccine so now feels comfortable moving forward to schedule the TURP, he will call [facility] to see what is the hold up on is records - please advise - ? ok to schedule the surgery. Please call patient @ [phone]                Action Taken                     [name],[name]  05/17/2019 9:51:33 AM >      [name] 05/17/2019 9:58:19 AM > lets get him an appt first: we can move up his April appt and let him know we can discuss this then      [facility]  05/17/2019 10:02:10 AM > pt called back with [facility] fax to request records [id number],  pls request records from [facility]  05/17/2019 10:11:21 AM > faxed records                     * * *                ---          * * *         Provider: Aletta Edouard 05/17/2019    ---    Note generated by eClinicalWorks EMR/PM Software ([email or web address])

## 2019-05-18 ENCOUNTER — Ambulatory Visit: Admitting: Urology

## 2019-05-18 NOTE — Progress Notes (Signed)
* * *      [name], Tommy Frost **DOB:** 1955-06-29 (63 yo M) **Acc No.** [id number] **DOS:**  05/18/2019    ---       [name]**    ------    14 Y old Male, DOB: 05/19/1955    [name], [place], [name], [place] [address]    Home: [phone]    Provider: Aletta Edouard        * * *    Telephone Encounter    ---    Answered by  Margarite Gouge Date: 05/18/2019       Time: 01:32 PM    Caller  pt    ------            Reason  surgery            Message                     Having issues with [facility] with them faxing their records. Pt wants to know if he can have surgery without [facility] records?                Action Taken                     Jacia Sickman,Patricia  05/18/2019 1:32:52 PM > please advise      Mariadelcarmen Corella E 05/18/2019 1:45:42 PM > please see other tel records from yesterday :; the answer is yes but I need to see him in the office first and do a cystoscopy here so please schedule him for an office cysto and if he is hesitant then jsut an office visit or he can wait until his scheduled appt in April. if he is move dup make him a 30 minute visit      Ruben Mahler,Patricia  05/18/2019 3:53:28 PM > pt had cysto on 01/29 - wants to know if that is good enough or does he need another one?      Antonique Langford E 05/18/2019 3:55:28 PM > I forgot.since it was an IOV. he does not need a cysto but he does need an appt to discuss this      Lovelle Lema,Patricia  05/18/2019 4:00:25 PM > spoke with pt - he needs to be with his father in [place] for some time. He will keep his appt in April and rediscuss TURP then.                    * * *                ---          * * *         Provider: Aletta Edouard 05/18/2019    ---    Note generated by eClinicalWorks EMR/PM Software ([email or web address])

## 2019-05-31 ENCOUNTER — Ambulatory Visit: Admitting: Specialist

## 2019-06-27 ENCOUNTER — Ambulatory Visit: Admitting: Urology

## 2019-06-27 NOTE — Progress Notes (Signed)
* * *      Tommy Frost, Tommy Frost **DOB:** 06/04/1955 (64 yo M) **Acc No.** [id number] **DOS:**  06/27/2019    ---       [name]**    ------    57 Y old Male, DOB: 1955/06/06    [name], [place], [name], [place] [address]    Home: [phone]    Provider: Aletta Edouard        * * *    Telephone Encounter    ---    Answered by  Wendelyn Breslow Date: 06/27/2019       Time: 09:17 AM    Caller  pt    ------            Reason  Cancel 4/28/ appt            Message                     Patient needs to cancel appt 06/29/2019. He is in [place] with elderly father. Patient will call back when he returns to r/s appt.                Action Taken                     Morse,Linda  06/27/2019 9:20:13 AM >      Scharmer,Ashleigh  06/27/2019 9:25:47 AM > Dr. Roselee Nova, [place]ain Childes.                     * * *                ---          * * *        Provider: Aletta Edouard 06/27/2019    ---    Note generated by eClinicalWorks EMR/PM Software ([email or web address])

## 2019-07-25 LAB — PSA SCREENING (EXT): PSA (EXT): 2.1 ng/mL (ref 0.0–5.0)

## 2019-07-25 LAB — HEMOGLOBIN A1C: HEMOGLOBIN A1C % (INT/EXT): 5.9 % — ABNORMAL HIGH (ref 4.6–5.6)

## 2019-08-15 ENCOUNTER — Ambulatory Visit: Admitting: Urology

## 2019-08-15 NOTE — Progress Notes (Signed)
* * *      Tommy Frost, Tommy Frost **DOB:** [birth year] (64 yo M) **Acc No.** [id number] **DOS:**  08/15/2019    ---       Tommy Frost**    ------    65 Y old Male, DOB: November 05, 1955    [name], [place], [name], [place] [address]    Home: [phone]    Provider: Aletta Edouard        * * *    Telephone Encounter    ---    Answered by  Margarite Gouge Date: 08/15/2019       Time: 09:06 AM    Caller  pt    ------            Reason  records            Message                     Pt is having a hard time getting records from [facility] - he was going in person to get them. However, that building is doing covid vaccines. Would like [place] 1 more time to work on getting records            phone # [name] [phone]                Action Taken                     [name],Patricia  08/15/2019 9:08:58 AM >      [name],Somchay  08/17/2019 8:02:11 AM > faxed record request to [facility]  08/17/2019 1:39:38 PM > notes arrived                    * * *                ---          * * *        Provider: Aletta Edouard 08/15/2019    ---    Note generated by eClinicalWorks EMR/PM Software ([email or web address])

## 2019-09-27 ENCOUNTER — Ambulatory Visit: Admitting: Urology

## 2019-09-27 ENCOUNTER — Ambulatory Visit

## 2019-09-27 NOTE — Progress Notes (Signed)
 * * *    Tommy Frost, Tommy Frost **DOB:** 10/14/1955 (64 yo M) **Acc No.** [id number] **DOS:**  09/27/2019    ---       Tommy Frost**    ------    64 Y old Male, DOB: [birth year]    Account Number: [id number]    [address], [place], [place]    Home: [phone]    Guarantor: Tommy Frost, Tommy Frost Insurance: Medicare Payer ID: [id number]    PCP: Venita Sheffield    Appointment Facility: [facility]        * * *    09/27/2019 Progress Notes: Adah Salvage. Roselee Nova, M.D.    ------    ---       **Current Medications**    ---    Taking    * Ozempic     ---    * amLODIPine Besylate 5 MG Tablet 1 tablet Orally Once a day    ---    * Lisinopril 40 MG Tablet 1 tablet Orally Once a day    ---    * Omeprazole 20 MG Capsule Delayed Release 1 capsule 30 minutes before morning meal Orally Once a day    ---    * metFORMIN HCl 500 MG Tablet 1 tablet with a meal Orally twice a day    ---    * Pravastatin Sodium 40 MG Tablet 1 tablet Orally Once a day    ---    * Finasteride 5 MG Tablet 1 tablet Orally Once a day, stop date 10/18/2019    ---    Medication List reviewed and reconciled with the patient    ---     Past Medical History    ---      Benign prostatic hyperplasia (BPH).        ---    Diabetes.        ---    Hypertension.        ---    GERD.        ---    Hypercholesterolemia.        ---    COVID Vacc: Pfizer 06/2019.        ---      **Surgical History**    ---      TURP at [facility] and cysto 2018: open but with some ant BN tissue 04/2015    ---    tonsillectomy    ---    cystoscopy: BPH and bladder neck tissue 04/01/2019    ---      **Family History**    ---      Father: alive 90 yrs, prostate cancer, diagnosed with Unspecified essential  hypertension    ---    Mother: deceased 47 yrs, COPD    ---    Children: alive    ---    Siblings: alive, diagnosed with Unspecified essential hypertension    ---    1 brother(s) . 1 son(s) , 1 daughter(s) - healthy.    ---     **Social History**     ---    Alcohol: yes, social.    no Smoking status: never smoker, Patient counseled on the dangers of tobacco  use: 09/27/2019.    Occupation: retired.    no Recreational drug use.    Caffeine: yes, frequency:, 1-2 cups/day, coffee.     **Allergies**    ---      N.K.D.A.    ---      **Hospitalization/Major Diagnostic  Procedure**    ---      clot retention 7/31-8/12020    ---    clot retention 03/2019    ---      **Review of Systems**    ---     _General_ :    no flank pain. no Fever. no Chills. no Weakness. no Abdominal pain.    _Endocrine_ :    Patient denies: blood sugar issues.    _Pulmonary_ :    no shortness of breath.    _Gastroenterology_ :    no Constipation. no Diarrhea.    _Urology_ :    no Dysuria. no Blood in urine. no Frequent urination. no Urinary incontinence.          **Reason for Appointment**    ---      1\. BPH    ---      **History of Present Illness**    ---     _Urological History_ :    pt is voiding well, no hematuria, good flow, nocturia x 1; no dysuria.      **Vital Signs**    ---    Ht **5'9"** , Wt **300** , BMI **44.30** , BP **134/72** , Temp **100.2**.      **Examination**    ---     _Genitourinary - Male:_    General appearance:  alert, oriented, no apparent distress, obese, pleasant.  Abdomen:  soft, non-tender, no mass, non-distended, no cvat. Anus/Perineum:  anus was intact, no lesions, no rashes, no fissures/fistulas. Digital Rectal  Exam (DRE):  normal, no masses. Epididymis:  normal, non-tender, bilateral.  Penis  normal, no rashes. Prostate  smooth, normal, non-tender, slightly  enlarged. Scrotum:  no rashes, no lesions, normal. Sphincter tone:  normal.  Testicles:  not enlarged. non-tender, normal bilaterally. Urethral meatus:  normal, no discharge.         **Assessments**    ---    1\. Gross hematuria - R31.0 (Primary)    ---    2\. Benign prostatic hyperplasia with lower urinary tract symptoms, symptom  details unspecified - N40.1    ---      **Treatment**    ---       **1\. Gross hematuria**    Notes: he has had no more episodes of this and his UA today looked very good.  We discussed again about continuing on the finasteride vs. doing a TURP and i  think that for now it is safe to continue the conservative treatment but if he  gets more gross hematuria them we may need to do the TURP.    ---        **2\. Benign prostatic hyperplasia with lower urinary tract symptoms, symptom  details unspecified**    Notes: As above. he had a large PVR today but did not empty completely when  giving [place] his UA and feels he is voiding well.        **3\. Others**    Notes: Extra time was needed to go over the 66 pages of records from [facility].      **Labs**    ------    _Lab: ClinitekStatus SG_    ---       Value Reference Range    ---------    Color yellow/clear   \-    GLU Negative   \-    ------------    BIL Negative   \-    ------------    KET Negative   \-    ------------  SG 1.020   \-    ------------    BLO Small   \-    ------------    pH 5.5   \-    ------------    PRO Negative   \-    ------------    URO 0.2 Frost.U./dL   \-    ------------    NIT Negative   \-    ------------    LEU Negative   \-    ------------       Tommy Frost,Tommy Frost 09/27/2019 2:36:18 PM > Tommy Frost 09/27/2019 2:45:58 PM >    ------    _Lab: PSA, total_ 1.9 ng/ml       Value Reference Range    ---------    level 1.9 ng/ml        Tommy Frost,Tommy Frost 09/27/2019 2:58:41 PM > Tommy Frost 09/27/2019 3:01:03 PM >    ------    _Lab: Urine Microscopy_       Value Reference Range    ---------    WBC 0     RBC 0-2     ------------    Epithelial 0     ------------    Bacteria 0     ------------    Amorphous trace     ------------    Mucous 0     ------------    Casts 0     ------------    Other 0     ------------       Tommy Frost,Tommy Frost 09/27/2019 3:05:42 PM > Tommy Frost 09/27/2019 3:18:40 PM >     ------      Procedure Orders:    ------    _Procedure: BLADDER SCAN_    ---       [name],Somchay 09/27/2019 2:50:59 PM > PVR approx 197cc    ------      **Procedure Codes**    ---      81003 UA Automated    ---    10272 PSA    ---    53664 MICROSCOPIC EXAM OF URINE    ---    40347 BLADDER SCAN    ---      **Follow Up**    ---    6 Months    Electronically signed by Tommy Frost , MD on 09/27/2019 at 04:52 PM EDT    Sign off status: Completed        * * [facility]    [address]    [place], [name] [address]    Tel: [phone]    Fax: [phone]              * * *          Progress Note: Adah Salvage. Roselee Nova, M.D. 09/27/2019    ---    Note generated by eClinicalWorks EMR/PM Software ([email or web address])

## 2019-10-16 ENCOUNTER — Ambulatory Visit: Admitting: Urology

## 2020-03-12 ENCOUNTER — Ambulatory Visit: Admitting: Urology

## 2020-04-03 ENCOUNTER — Ambulatory Visit (HOSPITAL_BASED_OUTPATIENT_CLINIC_OR_DEPARTMENT_OTHER)

## 2020-04-03 ENCOUNTER — Ambulatory Visit

## 2020-04-03 ENCOUNTER — Ambulatory Visit: Admitting: Urology

## 2020-04-03 NOTE — Progress Notes (Signed)
 * * *    Tommy Frost, Tommy Frost **DOB:** [birth year] (65 yo M) **Acc No.** [id number] **DOS:**  04/03/2020    ---       Tommy Frost**    ------    89 Y old Male, DOB: November 11, 1955    Account Number: [id number]    [address], [place], [id number]    Home: [phone]    Guarantor: Tommy Frost Insurance: Medicare Payer ID: [id number]    PCP: Tommy Frost    Appointment Facility: [facility], [facility].        * * *    04/03/2020 Tommy Frost Tommy Frost, M.D.    ------    ---       **Current Medications**    ---    Taking    * hydroCHLOROthiazide 25 MG Tablet 1 tablet in the morning Orally Once a day    ---    * Ozempic     ---    * Lisinopril 40 MG Tablet 1 tablet Orally Once a day    ---    * Omeprazole 20 MG Capsule Delayed Release 1 capsule 30 minutes before morning meal Orally Once a day    ---    * metFORMIN HCl 500 MG Tablet 1 tablet with a meal Orally twice a day    ---    * Pravastatin Sodium 40 MG Tablet 1 tablet Orally Once a day    ---    * Finasteride 5 MG Tablet TAKE 1 TABLET BY MOUTH EVERY DAY     ---    Not-Taking/PRN    * amLODIPine Besylate 5 MG Tablet 1 tablet Orally Once a day    ---     Past Medical History    ---      Benign prostatic hyperplasia (BPH).        ---    Diabetes.        ---    Hypertension.        ---    GERD.        ---    Hypercholesterolemia.        ---    COVID Vacc: Pfizer 06/2019.        ---      **Surgical History**    ---      TURP at [facility] and cysto 2018: open but with some ant BN tissue 04/2015    ---    tonsillectomy    ---    cystoscopy: BPH and bladder neck tissue 04/01/2019    ---    skin cancers removed 2021    ---      **Family History**    ---      Father: alive 49 yrs, prostate cancer, diagnosed with Unspecified essential  hypertension    ---    Mother: deceased 70 yrs, COPD    ---    Children: alive    ---    Siblings: alive, diagnosed with Unspecified essential hypertension    ---    1 brother(s) . 1 son(s) , 1 daughter(s)  - healthy.    ---     **Social History**    ---    Alcohol: yes, social.    no Smoking status: never smoker, Patient counseled on the dangers of tobacco  use: 04/03/2020.    Occupation: retired.    no Recreational drug use.    Caffeine: yes, frequency:, 1-2 cups/day, coffee.     **Hospitalization/Major Diagnostic Procedure**    ---  clot retention 7/31-8/12020    ---    clot retention 03/2019    ---      **Review of Systems**    ---     _General_ :    no flank pain. no Fever. no Chills. no Abdominal pain.    _Pulmonary_ :    no shortness of breath.    _Gastroenterology_ :    no Constipation. no Diarrhea.    _Urology_ :    no Dysuria. no Blood in urine. no Frequent urination. no Urinary incontinence.          **Reason for Appointment**    ---      1\. 6 month    ---    2\. BPH    ---      **History of Present Illness**    ---     _Urological History_ :    The pt consents to a telephone visit: pt is voiding well: UA was normal: good  flow, good control, nocturia x 1 and a little slower at night.      **Examination**    ---     _General:_    general  pt sounds good over the phone: NAD.         **Assessments**    ---    1\. Benign prostatic hyperplasia with lower urinary tract symptoms, symptom  details unspecified - N40.1 (Primary)    ---    2\. Gross hematuria - R31.0    ---     On May 12, 2018, the World Health Organization declared the COVID-19 viral  disease to be a pandemic. As a result of this emergency, the practice patterns  of Physicians, Physician Assistants and Nurse Practitioners are shifting to  accommodate the need to treat in conjunction with unprecedented guidance from  federal, state and local authorities which include, but are not limited to,  self-quarantine and/or limiting physical proximity to others under any number  of circumstances. It is within this context (and understanding that this  method of patient encounter is in the patients best interest as well as the  health and  safety of other patients and the public) that telehealth is being  provided for this patient encounter rather than a face to face visit. This  patient encounter is appropriate and reasonable under the circumstances given  the patients particular presentation at this time. The patient has been  advised of the potential risks and limitations of this mode of treatment  (including, but not limited to, the absence of an in-person exam) and has  agreed to be treated in a remote fashion in spite of them. Any and all of the  patients or their familys questions on this issue have been answered and I  have made no promises or guarantees to the patient. The patient has also been  advised to contact this office for worsening conditions or problems, and seek  emergency medical treatment and/or call 911 if the patient deems either  necessary.    ---      **Treatment**    ---      **1\. Benign prostatic hyperplasia with lower urinary tract symptoms,  symptom details unspecified**    Notes: He is voiding well and will stay on the finasteride for now and call me  with any changes in voiding.    ---        **2\. Gross hematuria**    Notes: Not an issue for now.      **Labs**    ------  _Lab: ClinitekStatus SG_    ---       Value Reference Range    ---------    Color yellow/clear   \-    GLU Negative   \-    ------------    BIL Negative   \-    ------------    KET Negative   \-    ------------    SG 1.020   \-    ------------    BLO Small   \-    ------------    pH 5.0   \-    ------------    PRO Negative   \-    ------------    URO 0.2 TommyU./dL   \-    ------------    NIT Negative   \-    ------------    LEU Negative   \-    ------------       Tommy Frost 04/03/2020 10:33:29 AM >    ------    _Lab: Urine Microscopy_       Value Reference Range    ---------    WBC 0-2     RBC 0-1     ------------    Epithelial 0     ------------    Bacteria  0     ------------    Amorphous +     ------------    Mucous 0     ------------    Casts 0     ------------       Tommy Frost 04/03/2020 12:11:08 PM > Millee Denise E 04/03/2020  12:32:09 PM >    ------      **Procedure Codes**    ---      81003 UA Automated    ---    62952 MICROSCOPIC EXAM OF URINE    ---      **Follow Up**    ---    6 Months    Electronically signed by Leola Brazil , MD on 04/03/2020 at 02:40 PM EST    Sign off status: Completed        * * [facility]    [address]    [place], [name] [address]    Tel: [phone]    Fax: [phone]              * * *          Progress Note: Adah Salvage. Roselee Frost, M.D. 04/03/2020    ---    Note generated by eClinicalWorks EMR/PM Software ([email or web address])

## 2020-08-31 ENCOUNTER — Encounter

## 2020-08-31 ENCOUNTER — Other Ambulatory Visit (INDEPENDENT_AMBULATORY_CARE_PROVIDER_SITE_OTHER): Admitting: Urology

## 2020-09-08 ENCOUNTER — Inpatient Hospital Stay: Admit: 2020-09-08 | Disposition: A | Discharge status: Home / Self Care | Payer: MEDICARE

## 2020-09-08 ENCOUNTER — Encounter (INDEPENDENT_AMBULATORY_CARE_PROVIDER_SITE_OTHER)

## 2020-09-08 ENCOUNTER — Other Ambulatory Visit

## 2020-09-08 LAB — POCT STREP A PCR: POCT Strep A PCR: NOT DETECTED

## 2020-09-08 LAB — POCT SARS-COV-2/FLU A&B
POCT Flu A: NOT DETECTED
POCT Flu B: NOT DETECTED
POCT SARS-CoV-2: NOT DETECTED

## 2020-09-10 ENCOUNTER — Emergency Department: Payer: MEDICARE

## 2020-09-10 ENCOUNTER — Other Ambulatory Visit

## 2020-09-10 ENCOUNTER — Inpatient Hospital Stay
Admit: 2020-09-10 | Discharge: 2020-09-10 | Disposition: A | Discharge status: Home / Self Care | Payer: MEDICARE | Attending: Emergency Medicine

## 2020-09-10 ENCOUNTER — Encounter (HOSPITAL_BASED_OUTPATIENT_CLINIC_OR_DEPARTMENT_OTHER)

## 2020-09-10 ENCOUNTER — Emergency Department: Admit: 2020-09-10 | Payer: MEDICARE

## 2020-09-10 DIAGNOSIS — J029 Acute pharyngitis, unspecified: Secondary | ICD-10-CM

## 2020-09-10 LAB — SARS/FLU/RSV
Influenza A RNA: NOT DETECTED
Influenza B RNA: NOT DETECTED
Respiratory syncytial virus: NOT DETECTED
SARS-CoV-2 RNA PCR: NOT DETECTED

## 2020-09-10 MED ORDER — predniSONE (Deltasone) 20 mg tablet
20 | ORAL_TABLET | Freq: Every day | ORAL | 0 refills | Status: AC
Start: 2020-09-10 — End: 2020-09-13

## 2020-09-10 MED ORDER — doxycycline (Vibramycin) 100 mg capsule
100 | ORAL_CAPSULE | Freq: Two times a day (BID) | ORAL | 0 refills | Status: AC
Start: 2020-09-10 — End: 2020-09-17

## 2020-09-10 NOTE — Other (Signed)
 Patient Education   Table of Contents       Acute Bronchitis, Adult     To view videos and all your education online visit,   [email or web address].com/ke934jw   or scan this QR code with your smartphone.                    Acute Bronchitis, Adult        Acute bronchitis is when air tubes in the lungs (bronchi) suddenly get swollen. The condition can make it hard for you to breathe. In adults, acute bronchitis usually goes away within 2 weeks. A cough caused by bronchitis may last up to 3 weeks. Smoking, allergies, and asthma can make the condition worse.     What are the causes?    This condition is caused by:       Cold and flu viruses. The most common cause of this condition is the virus that causes the common cold.       Bacteria.      Substances that irritate the lungs, including:       Smoke from cigarettes and other types of tobacco.       Dust and pollen.       Fumes from chemicals, gases, or burned fuel.       Other materials that pollute indoor or outdoor air.       Close contact with someone who has acute bronchitis.     What increases the risk?    The following factors may make you more likely to develop this condition:       A weak body's defense system. This is also called the immune system.       Any condition that affects your lungs and breathing, such as asthma.     What are the signs or symptoms?    Symptoms of this condition include:       A cough.       Coughing up clear, yellow, or green mucus.       Wheezing.       Having too much mucus in your lungs (chest congestion).       Shortness of breath.       A fever.       Chills.       Body aches.       A sore throat.     How is this treated?    Acute bronchitis may go away over time without treatment. Your doctor may recommend:       Drinking more fluids.       Using a device that gets medicine into your lungs (inhaler).       Using a vaporizer or a humidifier. These are machines that add water or moisture to the air. This helps with coughing and  poor breathing.       Taking a medicine for fever.       Taking a medicine that thins mucus and clears congestion.       Taking a medicine that prevents or stops coughing.     Follow these instructions at home:   Activity         Get a lot of rest.       Return to your normal activities as told by your doctor. Ask your doctor what activities are safe for you.     Lifestyle            Drink enough fluid to keep your pee (  urine) pale yellow.      Do not  drink alcohol.      Do not  use any products that contain nicotine or tobacco, such as cigarettes, e-cigarettes, and chewing tobacco. If you need help quitting, ask your doctor. Be aware that:       Your bronchitis will get worse if you smoke or breathe in other people's smoke (secondhand smoke).       Your lungs will heal faster if you quit smoking.     General instructions         Take over-the-counter and prescription medicines only as told by your doctor.       Use an inhaler, cool mist vaporizer, or humidifier as told by your doctor.       Rinse your mouth often with salt water. To make salt water, dissolve ??1 tsp (3?6 g) of salt in 1 cup (237 mL) of warm water.       Take two teaspoons of honey at bedtime. This helps lessen your coughing at night.       Keep all follow-up visits as told by your doctor. This is important.       How is this prevented?       To lower your risk of getting this condition again:       Wash your hands often with soap and water. If you cannot use soap and water, use hand sanitizer.       Avoid contact with people who have cold symptoms.       Try not to touch your mouth, nose, or eyes with your hands.       Make sure to get the flu shot every year.       Contact a doctor if:         Your symptoms do not get better in 2 weeks.       You vomit more than once or twice.      You have symptoms of loss of fluid from your body (dehydration). These include:       Dark pee.       Dry skin or eyes.       Increased thirst.       Headaches.        Confusion.       Muscle cramps.     Get help right away if:         You cough up blood.       You have chest pain.       You have very bad shortness of breath.       You become dehydrated.       You faint or keep feeling like you are going to faint.       You have a very bad headache.       Your fever or chills get worse.     These symptoms may be an emergency. Get help right away. Call your local emergency services (911 in the [place]      Do not wait to see if the symptoms will go away.      Do not drive yourself to the hospital.     Summary         Acute bronchitis is when air tubes in the lungs (bronchi) suddenly get swollen. In adults, acute bronchitis usually goes away within 2 weeks.       Take over-the-counter and prescription medicines only as told by your doctor.  Drink enough fluid to keep your pee (urine) pale yellow.       Contact a doctor if your symptoms do not improve after 2 weeks of treatment.       Get help right away if you cough up blood, faint, or have chest pain or shortness of breath.     This information is not intended to replace advice given to you by your health care provider. Make sure you discuss any questions you have with your health care provider.     Document Released: 06/05/2009Document Revised: 11/17/2021Document Reviewed: 09/10/2018     Elsevier Patient Education ? 2022 Elsevier Inc.

## 2020-09-10 NOTE — Other (Signed)
 Patient Education   Table of Contents       Acute Bronchitis, Adult     To view videos and all your education online visit,   [email or web address].elsevier.[email or web address]   or scan this QR code with your smartphone.                    Acute Bronchitis, Adult        Acute bronchitis is when air tubes in the lungs (bronchi) suddenly get swollen. The condition can make it hard for you to breathe. In adults, acute bronchitis usually goes away within 2 weeks. A cough caused by bronchitis may last up to 3 weeks. Smoking, allergies, and asthma can make the condition worse.     What are the causes?    This condition is caused by:       Cold and flu viruses. The most common cause of this condition is the virus that causes the common cold.       Bacteria.      Substances that irritate the lungs, including:       Smoke from cigarettes and other types of tobacco.       Dust and pollen.       Fumes from chemicals, gases, or burned fuel.       Other materials that pollute indoor or outdoor air.       Close contact with someone who has acute bronchitis.     What increases the risk?    The following factors may make you more likely to develop this condition:       A weak body's defense system. This is also called the immune system.       Any condition that affects your lungs and breathing, such as asthma.     What are the signs or symptoms?    Symptoms of this condition include:       A cough.       Coughing up clear, yellow, or green mucus.       Wheezing.       Having too much mucus in your lungs (chest congestion).       Shortness of breath.       A fever.       Chills.       Body aches.       A sore throat.     How is this treated?    Acute bronchitis may go away over time without treatment. Your doctor may recommend:       Drinking more fluids.       Using a device that gets medicine into your lungs (inhaler).       Using a vaporizer or a humidifier. These are machines that add water or moisture to the air. This helps with coughing and  poor breathing.       Taking a medicine for fever.       Taking a medicine that thins mucus and clears congestion.       Taking a medicine that prevents or stops coughing.     Follow these instructions at home:   Activity         Get a lot of rest.       Return to your normal activities as told by your doctor. Ask your doctor what activities are safe for you.     Lifestyle            Drink enough fluid to keep your pee (  urine) pale yellow.      Do not  drink alcohol.      Do not  use any products that contain nicotine or tobacco, such as cigarettes, e-cigarettes, and chewing tobacco. If you need help quitting, ask your doctor. Be aware that:       Your bronchitis will get worse if you smoke or breathe in other people's smoke (secondhand smoke).       Your lungs will heal faster if you quit smoking.     General instructions         Take over-the-counter and prescription medicines only as told by your doctor.       Use an inhaler, cool mist vaporizer, or humidifier as told by your doctor.       Rinse your mouth often with salt water. To make salt water, dissolve ??1 tsp (3?6 g) of salt in 1 cup (237 mL) of warm water.       Take two teaspoons of honey at bedtime. This helps lessen your coughing at night.       Keep all follow-up visits as told by your doctor. This is important.       How is this prevented?       To lower your risk of getting this condition again:       Wash your hands often with soap and water. If you cannot use soap and water, use hand sanitizer.       Avoid contact with people who have cold symptoms.       Try not to touch your mouth, nose, or eyes with your hands.       Make sure to get the flu shot every year.       Contact a doctor if:         Your symptoms do not get better in 2 weeks.       You vomit more than once or twice.      You have symptoms of loss of fluid from your body (dehydration). These include:       Dark pee.       Dry skin or eyes.       Increased thirst.       Headaches.        Confusion.       Muscle cramps.     Get help right away if:         You cough up blood.       You have chest pain.       You have very bad shortness of breath.       You become dehydrated.       You faint or keep feeling like you are going to faint.       You have a very bad headache.       Your fever or chills get worse.     These symptoms may be an emergency. Get help right away. Call your local emergency services (911 in the [place]      Do not wait to see if the symptoms will go away.      Do not drive yourself to the hospital.     Summary         Acute bronchitis is when air tubes in the lungs (bronchi) suddenly get swollen. In adults, acute bronchitis usually goes away within 2 weeks.       Take over-the-counter and prescription medicines only as told by your doctor.  Drink enough fluid to keep your pee (urine) pale yellow.       Contact a doctor if your symptoms do not improve after 2 weeks of treatment.       Get help right away if you cough up blood, faint, or have chest pain or shortness of breath.     This information is not intended to replace advice given to you by your health care provider. Make sure you discuss any questions you have with your health care provider.     Document Released: 06/05/2009Document Revised: 11/17/2021Document Reviewed: 09/10/2018     Elsevier Patient Education ? 2022 Elsevier Inc.

## 2020-09-10 NOTE — ED Provider Notes (Signed)
 HPI   Chief Complaint   Patient presents with   ? Cough     Cough x 5 days. With productive green sputum Hx of sinus infection negative for Covid in Outpt clinic.        This is a 65 years old male presented to the emergency room department with a chief complaint of cough, congestion x4 5 days.  He was seen in the urgent care this weekend he tested negative for COVID.  He continues to cough and presented here this morning                    Glasgow Coma Scale Score: 15                                  Patient History   Past Medical History:   Diagnosis Date   ? Diabetes mellitus (CMS/HCC)    ? Hypertension      History reviewed. No pertinent surgical history.  No family history on file.  Social History     Tobacco Use   ? Smoking status: Never Smoker   ? Smokeless tobacco: Never Used   Substance Use Topics   ? Alcohol use: Not on file   ? Drug use: Not on file       Review of Systems   Review of Systems   HENT: Positive for congestion.    Respiratory: Positive for cough.    Gastrointestinal: Negative.    Musculoskeletal: Negative.    Neurological: Negative.    All other systems reviewed and are negative.      Physical Exam   ED Triage Vitals [09/10/20 0545]   Temp Pulse Resp BP   36.7 ?C (98.1 ?F) 93 20 135/82      SpO2 Temp Source Heart Rate Source Patient Position   95 % Oral Monitor Sitting      BP Location FiO2 (%)     Left arm --       Physical Exam  Vitals and nursing note reviewed.   HENT:      Head: Normocephalic.      Right Ear: Tympanic membrane normal.      Left Ear: Tympanic membrane normal.      Nose: Nose normal.      Mouth/Throat:      Mouth: Mucous membranes are moist.   Cardiovascular:      Rate and Rhythm: Normal rate and regular rhythm.      Pulses: Normal pulses.   Pulmonary:      Breath sounds: Rhonchi present.   Abdominal:      General: Abdomen is flat.      Palpations: Abdomen is soft.   Musculoskeletal:         General: Normal range of motion.      Cervical back: Normal range of motion.    Skin:     General: Skin is warm.   Neurological:      General: No focal deficit present.      Mental Status: He is alert.   Psychiatric:         Mood and Affect: Mood normal.       XR CHEST 2 VIEWS   Final Result   No active cardiopulmonary disease.            Tollie Eth 09/10/2020 7:55 AM          Labs Reviewed  SARS/FLU/RSV - Normal       Result Value    Influenza A DNA Not Detected      Influenza B DNA Not Detected      SARS-CoV-2 RNA PCR Not Detected      Respiratory syncytial virus Not Detected         ED Course & MDM   ED Course as of 09/10/20 0937   Mon Sep 10, 2020   0936 Chest x-ray was negative flu SARS negative, he is oxygenating well he has no fever, he continues to have a productive cough I will treat him as a purulent bronchitis with p.o. Doxy and 3 days of prednisone [FC]      ED Course User Index  [FC] Salli Real, MD         Diagnoses as of 09/10/20 0937   Acute bronchitis, unspecified organism       MDM       Salli Real, MD  09/10/20 [phone]

## 2020-09-14 LAB — BMP (EXT)
Anion Gap (EXT): 11 mmol/L (ref 2–15)
BUN (EXT): 27 mg/dL — ABNORMAL HIGH (ref 7–24)
CO2 (EXT): 28 mmol/L (ref 24–32)
CalciumCalcium (EXT): 9.6 mg/dL (ref 8.5–10.5)
Chloride (EXT): 99 mmol/L (ref 98–110)
Creatinine (EXT): 1.3 mg/dL (ref 0.6–1.3)
Glucose (EXT): 138 mg/dL — ABNORMAL HIGH (ref 70–118)
Potassium (EXT): 5.3 mmol/L — ABNORMAL HIGH (ref 3.4–5.2)
Sodium (EXT): 138 mmol/L (ref 135–146)
eGFR - Creat MDRD (EXT): 55 mL/min/BSA — ABNORMAL LOW (ref >=60)

## 2020-09-14 LAB — PSA SCREENING (EXT): PSA (EXT): 1.7 ng/mL (ref 0.0–5.0)

## 2020-09-14 LAB — UNMAPPED LAB RESULTS: Creatinine, urine, random (INT/EXT): 157.2 mg/dL (ref >=15)

## 2020-09-14 LAB — HEMOGLOBIN A1C: HEMOGLOBIN A1C % (INT/EXT): 6.4 % — ABNORMAL HIGH (ref 4.6–5.6)

## 2020-09-23 ENCOUNTER — Encounter

## 2020-09-23 ENCOUNTER — Encounter (INDEPENDENT_AMBULATORY_CARE_PROVIDER_SITE_OTHER): Admitting: Urology

## 2020-10-02 ENCOUNTER — Ambulatory Visit: Admit: 2020-10-02 | Discharge: 2020-10-02 | Payer: MEDICARE | Attending: Urology

## 2020-10-02 ENCOUNTER — Other Ambulatory Visit

## 2020-10-02 VITALS — BP 150/76 | Temp 100.1°F | Wt 307.0 lb

## 2020-10-02 DIAGNOSIS — N401 Enlarged prostate with lower urinary tract symptoms: Secondary | ICD-10-CM

## 2020-10-02 LAB — POCT MICRO
Bacteria: 0 [HPF]
CASTS/IPF: 0
EPITHELIAL/HPF, POC: 0 [HPF]
Mucous: 0
Other POC: 0
WBC: 0 [HPF]

## 2020-10-02 LAB — POCT URINALYSIS DIPSTICK
Bilirubin, UA, POC: NEGATIVE
Glucose, UA, POC: NEGATIVE
Ketones, UA, POC: NEGATIVE
Leukocytes, UA, POC: NEGATIVE
Nitrite, UA, POC: NEGATIVE
Protein, UA, POC: NEGATIVE
Spec Gravity, UA, POC: 1.01
Urobilinogen, UA, POC: 0.2 mg/dL
pH, UA, POC: 5.5

## 2020-10-02 NOTE — Addendum Note (Signed)
 Addended by: Shayne Alken on: 10/16/2020 05:40 PM     Modules accepted: Orders

## 2020-10-02 NOTE — Progress Notes (Signed)
 [facility]  [facility]  [address]  [name] [address]  Dept: [phone]  Dept Fax: [phone]     Patient ID: Tommy Frost is a 65 y.o. male who presents for No chief complaint on file..    Subjective   HPIpt has a hx of BPH: a little more frequent last 2-3 months: good flow, empties usually; nocturia x 1-2; at times variable times where he goes every few hours, other time more often; no more hematuria  Also brought up ED at the last second: used sildenafil: works helps: 100mg  dose:     Current Outpatient Medications   Medication Instructions   . finasteride (Proscar) 5 mg tablet TAKE 1 TABLET BY MOUTH EVERY DAY   . guaiFENesin (MUCINEX) 1,200 mg, oral, 2 times daily, Do not crush, chew, or split.    . hydroCHLOROthiazide (HYDRODIURIL) 25 mg, oral, Daily   . lisinopril 40 mg, oral, Daily   . metFORMIN (Glucophage) 500 mg tablet TAKE 2 TABLETS BY MOUTH DAILY WITH DINNER.   Marland Kitchen omeprazole (PriLOSEC) 20 mg DR capsule 1 capsule, oral, Daily   . pravastatin (Pravachol) 40 mg tablet TAKE 1 TABLET BY MOUTH EVERYDAY AT BEDTIME     No Known Allergies  Past Medical History:   Diagnosis Date   . Benign prostatic hyperplasia with lower urinary tract symptoms    . Diabetes mellitus (CMS/HCC)    . GERD (gastroesophageal reflux disease)    . Gross hematuria    . Hypercholesterolemia    . Hypertension      Past Surgical History:   Procedure Laterality Date   . CYSTOSCOPY  04/01/2019    BPH and bladder neck tissue   . SKIN CANCER EXCISION  2021   . TONSILLECTOMY     . TRANSURETHRAL RESECTION OF PROSTATE  2018    at [facility]     Social History     Tobacco Use   . Smoking status: Never Smoker   . Smokeless tobacco: Never Used   Substance Use Topics   . Alcohol use: Not on file   . Drug use: Not on file       Objective   Visit Vitals  BP (!) 150/76   Temp 37.8 C (100.1 F)   Wt (!) 139 kg   BMI 44.05 kg/m   BSA 2.62 m      UA: normal  PVR 130ccs  Physical  Exam  Constitutional:       General: He is not in acute distress.     Appearance: Normal appearance. He is obese.   Abdominal:      General: There is no distension.      Palpations: Abdomen is soft.   Genitourinary:     Penis: Normal.       Testes: Normal.      Epididymis:      Right: Normal.      Left: Normal.      Rectum: Normal.      Comments: Prostate: smooth, slightly enlarged, non tender and non nodular  Neurological:      Mental Status: He is alert.         Assessment/Plan   Benign prostatic hyperplasia with lower urinary tract symptoms, symptom details unspecified  -     POCT urinalysis dipstick  -     POCT Micro  -     Bladder scan  He is generally urinating fairly well.  He had a slightly elevated PVR today but  did not complete his emptying when he gave [place] a urine he remains on the finasteride which seems to be helping.  He has had no episodes of any gross hematuria for a long time.  He knows to call us with any changes in urinating.  Combined arterial insufficiency and corporo-venous occlusive erectile dysfunction  He brought this up at the last second.  Apparently his old [facility] urologist used to give him sildenafil which worked well but he has never brought that up with me before.  He said that the 100 mg dose worked but he wants to find out where he can get it relatively cheap.  I mentioned that there is a place in [facility] that we can call some in for but he is good to look into [facility].  I told him to call us when he has more information and we can call some in for him.

## 2020-10-16 ENCOUNTER — Telehealth (INDEPENDENT_AMBULATORY_CARE_PROVIDER_SITE_OTHER)

## 2020-10-16 MED ORDER — sildenafil (Viagra) 100 mg tablet
100 | ORAL_TABLET | ORAL | 11 refills | 18.00000 days | Status: AC | PRN
Start: 2020-10-16 — End: 2020-11-15

## 2020-10-16 NOTE — Telephone Encounter (Signed)
 Pt notified sildenafil sent to pharmacy, pt already has good rx coupon.

## 2020-10-19 LAB — UNMAPPED LAB RESULTS: Phosphorous (EXT): 4.1 mg/dL (ref 2.3–4.6)

## 2020-10-20 ENCOUNTER — Encounter

## 2020-12-09 ENCOUNTER — Other Ambulatory Visit (INDEPENDENT_AMBULATORY_CARE_PROVIDER_SITE_OTHER): Admitting: Urology

## 2021-01-03 ENCOUNTER — Other Ambulatory Visit (INDEPENDENT_AMBULATORY_CARE_PROVIDER_SITE_OTHER): Admitting: Urology

## 2021-01-05 ENCOUNTER — Inpatient Hospital Stay: Admit: 2021-01-05 | Discharge: 2021-01-05 | Disposition: A | Discharge status: Home / Self Care | Payer: MEDICARE

## 2021-01-05 ENCOUNTER — Other Ambulatory Visit

## 2021-01-05 ENCOUNTER — Encounter (HOSPITAL_BASED_OUTPATIENT_CLINIC_OR_DEPARTMENT_OTHER)

## 2021-01-05 DIAGNOSIS — M5410 Radiculopathy, site unspecified: Secondary | ICD-10-CM

## 2021-01-05 MED ORDER — oxyCODONE-acetaminophen (Percocet) 5-325 mg tablet
5-325 | ORAL_TABLET | Freq: Three times a day (TID) | ORAL | 0 refills | Status: AC | PRN
Start: 2021-01-05 — End: 2021-01-08

## 2021-01-05 NOTE — ED Provider Notes (Signed)
 HPI   Chief Complaint   Patient presents with   . Extremity Pain      Right Shoulder pain for one month seen by Dr Rush Barer last month pt seen dr Campbell Lerner and started PT last week       65 year old male with history of BPH, diabetes, GERD, hyperlipidemia, hypertension presents emergency room today due to ongoing right upper back pain radiating to his right shoulder for the past 2 months.  Patient has seen Dr. August Luz has seen Dr. Nona Dell, has been prescribed NSAIDs, muscle relaxers as well as gabapentin and has had some relief with this as well as physical therapy however his symptoms seem to worsen over the past 4 days or so.  This pain has made it difficult for him to sleep comfortably, he still endorses full ROM of the right shoulder but feels like the pain is now intensified as of recently.  No numbness or tingling to the right upper extremity.  Denies any weakness of the right upper extremity.  He is scheduled to see Dr. Nona Dell in 10 days but does not feel like he can withstand the pain until this appointment.                    Glasgow Coma Scale Score: 15                                  Patient History   Past Medical History:   Diagnosis Date   . Benign prostatic hyperplasia with lower urinary tract symptoms    . Diabetes mellitus (CMS/HCC)    . GERD (gastroesophageal reflux disease)    . Gross hematuria    . Hypercholesterolemia    . Hypertension      Past Surgical History:   Procedure Laterality Date   . CYSTOSCOPY  04/01/2019    BPH and bladder neck tissue   . SKIN CANCER EXCISION  2021   . TONSILLECTOMY     . TRANSURETHRAL RESECTION OF PROSTATE  2018    at [facility]     No family history on file.  Social History     Tobacco Use   . Smoking status: Never   . Smokeless tobacco: Never   Substance Use Topics   . Alcohol use: Not on file   . Drug use: Not on file       Review of Systems   Review of Systems   Constitutional: Negative for activity change, chills and fever.   Musculoskeletal: Positive for arthralgias. Negative  for back pain, joint swelling, myalgias and neck pain.   Skin: Negative for color change.   Neurological: Negative for weakness and numbness.       Physical Exam   ED Triage Vitals   Temp Pulse Resp BP   01/05/21 1315 01/05/21 1315 01/05/21 1315 01/05/21 1315   37 C (98.6 F) 66 16 (!) 150/76      SpO2 Temp Source Heart Rate Source Patient Position   01/05/21 1315 01/05/21 1315 -- 01/05/21 1533   98 % Oral  Sitting      BP Location FiO2 (%)     01/05/21 1533 --     Left arm        Physical Exam  Vitals reviewed.   Constitutional:       General: He is not in acute distress.     Appearance: Normal appearance. He is obese. He is  not ill-appearing or toxic-appearing.   HENT:      Head: Normocephalic and atraumatic.   Cardiovascular:      Pulses: Normal pulses.   Pulmonary:      Effort: Pulmonary effort is normal.   Musculoskeletal:         General: No swelling or tenderness. Normal range of motion.   Skin:     General: Skin is warm.      Capillary Refill: Capillary refill takes less than 2 seconds.   Neurological:      General: No focal deficit present.      Mental Status: He is alert.      Sensory: No sensory deficit.   Psychiatric:         Mood and Affect: Mood normal.       No orders to display       Labs Reviewed - No data to display    ED Course & MDM   Diagnoses as of 01/05/21 1726   Radiculopathy, unspecified spinal region       MDM  Number of Diagnoses or Management Options  Radiculopathy, unspecified spinal region  Diagnosis management comments: 65 year old male with history of BPH, diabetes, GERD, hyperlipidemia, hypertension presents emergency room today due to ongoing right upper back pain radiating to his right shoulder for the past 2 months.  Vs wnl  No acute distress  Full ROM of the RUE with good strength  No specific area of ttp  Palpable distal pulse in RUE, no swelling or deformity noted  Full ROM of the neck  No need for imaging  No need for labs  We will discharge home with a short course of  narcotics for pain  F/u with outpatient orthopedist  Return with worsening  No further questions or concerns at the time of discharge.  Symptoms consistent with radicular pain originating from cervical region, he has been told about cervical stenosis from outpatient orthopedists as well           Saddie Benders, PA  01/05/21 1726

## 2021-01-05 NOTE — Discharge Instructions (Signed)
 Radicular Pain    Radicular pain is a type of pain that spreads from your back or neck along a spinal nerve. Spinal nerves are nerves that leave the spinal cord and go to the muscles. Radicular pain is sometimes called radiculopathy, radiculitis, or a pinched nerve. When you have this type of pain, you may also have weakness, numbness, or tingling in the area of your body that is supplied by the nerve. The pain may feel sharp and burning. Depending on which spinal nerve is affected, the pain may occur in the:  Neck area (cervical radicular pain). You may also feel pain, numbness, weakness, or tingling in the arms.  Mid-spine area (thoracic radicular pain). You would feel this pain in the back and chest. This type is rare.  Lower back area (lumbar radicular pain). You would feel this pain as low back pain. You may feel pain, numbness, weakness, or tingling in the buttocks or legs. Sciatica is a type of lumbar radicular pain that shoots down the back of the leg.    Radicular pain occurs when one of the spinal nerves becomes irritated or squeezed (compressed). It is often caused by something pushing on a spinal nerve, such as one of the bones of the spine (vertebrae) or one of the round cushions between vertebrae (intervertebral disks). This can result from:  An injury.   Wear and tear or aging of a disk.   The growth of a bone spur that pushes on the nerve.    Radicular pain often goes away when you follow instructions from your health care provider for relieving pain at home.  How is this treated?  Treatment may depend on the cause of the condition and may include:  Working with a physical therapist.  Taking pain medicine.  Applying heat or ice or both to the affected areas.  Doing stretches to improve flexibility.  Having surgery. This may be needed if other treatments do not help. Different types of surgery may be done depending on the cause of this condition.    Follow these instructions at home:  Managing  pain  If directed, put ice on the affected area. To do this:  Put ice in a plastic bag.  Place a towel between your skin and the bag.  Leave the ice on for 20 minutes, 2-3 times a day.  Remove the ice if your skin turns bright red. This is very important. If you cannot feel pain, heat, or cold, you have a greater risk of damage to the area.    If directed, apply heat to the affected area as often as told by your health care provider. Use the heat source that your health care provider recommends, such as a moist heat pack or a heating pad.  Place a towel between your skin and the heat source.  Leave the heat on for 20-30 minutes.  Remove the heat if your skin turns bright red. This is especially important if you are unable to feel pain, heat, or cold. You have a greater risk of getting burned.    Activity  Do not sit or rest in bed for long periods of time.  Try to stay as active as possible. Ask your health care provider what type of exercise or activity is best for you.  Avoid activities that make your pain worse, such as bending and lifting.  You may have to avoid lifting. Ask your health care provider how much you can safely lift.  Practice using  proper technique when lifting items. Proper lifting technique involves bending your knees and rising up.  Do strength and range-of-motion exercises only as told by your health care provider or physical therapist.    General instructions  Take over-the-counter and prescription medicines only as told by your health care provider.  Pay attention to any changes in your symptoms.  Keep all follow-up visits. This is important.    Contact a health care provider if:  Your pain and other symptoms get worse.  Your pain medicine is not helping.  Your pain has not improved after a few weeks of home care.  You have a fever.    Get help right away if:  You have severe pain, weakness, or numbness.  You have difficulty with bladder or bowel control.    Summary  Radicular pain is a type  of pain that spreads from your back or neck along a spinal nerve.  When you have radicular pain, you may also have weakness, numbness, or tingling in the area of your body that is supplied by the nerve.  The pain may feel sharp or burning.  Radicular pain may be treated with ice, heat, medicines, or physical therapy.    This information is not intended to replace advice given to you by your health care provider. Make sure you discuss any questions you have with your health care provider.  Document Released: 03/27/2004 Document Revised: 08/23/2020 Document Reviewed: 08/23/2020  Elsevier Patient Education  2022 [employer].

## 2021-01-08 ENCOUNTER — Other Ambulatory Visit (HOSPITAL_BASED_OUTPATIENT_CLINIC_OR_DEPARTMENT_OTHER): Admitting: Orthopaedic Surgery

## 2021-01-17 ENCOUNTER — Encounter

## 2021-01-17 DIAGNOSIS — M542 Cervicalgia: Secondary | ICD-10-CM

## 2021-01-17 DIAGNOSIS — M25511 Pain in right shoulder: Secondary | ICD-10-CM

## 2021-01-18 ENCOUNTER — Ambulatory Visit: Payer: MEDICARE

## 2021-04-20 ENCOUNTER — Other Ambulatory Visit (INDEPENDENT_AMBULATORY_CARE_PROVIDER_SITE_OTHER): Admitting: Urology

## 2021-06-17 MED ORDER — COVID-19 antigen test (QuickVue At-Home COVID-19 Test) kit
PACK | 0 refills | Status: AC
Start: 2021-06-17 — End: ?
  Filled 2021-06-17: qty 8, 30d supply, fill #0

## 2021-09-04 ENCOUNTER — Inpatient Hospital Stay: Admit: 2021-09-04 | Discharge: 2021-09-04 | Disposition: A | Discharge status: Home / Self Care | Payer: MEDICARE

## 2021-09-04 ENCOUNTER — Emergency Department: Admit: 2021-09-04 | Payer: MEDICARE

## 2021-09-04 DIAGNOSIS — M7732 Calcaneal spur, left foot: Secondary | ICD-10-CM

## 2021-09-04 DIAGNOSIS — M25572 Pain in left ankle and joints of left foot: Secondary | ICD-10-CM

## 2021-09-04 NOTE — Other (Signed)
Patient Education   Table of Contents     Ankle Pain     Heel Spur     To view videos and all your education online visit,   [email or web address]   or scan this QR code with your smartphone.   Access to this content will expire in one year.                    Ankle Pain     The ankle joint holds your body weight and allows you to move around. Ankle pain can occur on either side or the back of one ankle or both ankles. Ankle pain may be sharp and burning or dull and aching. There may be tenderness, stiffness, redness, or warmth around the ankle. Many things can cause ankle pain, including an injury to the area and overuse of the ankle.   Follow these instructions at home:   Activity     Rest your ankle as told by your health care provider. Avoid any activities that cause ankle pain.    Do not  use the injured limb to support your body weight until your health care provider says that you can. Use crutches as told by your health care provider.     Do exercises as told by your health care provider.     Ask your health care provider when it is safe to drive if you have a brace on your ankle.     If you have a brace:     Wear the brace as told by your health care provider. Remove it only as told by your health care provider.     Loosen the brace if your toes tingle, become numb, or turn cold and blue.     Keep the brace clean.     If the brace is not waterproof:    Do not  let it get wet.     Cover it with a watertight covering when you take a bath or shower.     If you were given an elastic bandage:        Remove it when you take a bath or a shower.     Try not to move your ankle very much, but wiggle your toes from time to time. This helps to prevent swelling.     Adjust the bandage to make it more comfortable if it feels too tight.     Loosen the bandage if you have numbness or tingling in your foot or if your foot turns cold and blue.     Managing pain, stiffness, and swelling        If directed, put ice on  the painful area.     If you have a removable brace or elastic bandage, remove it as told by your health care provider.     Put ice in a plastic bag.     Place a towel between your skin and the bag.     Leave the ice on for 20 minutes, 2?3 times a day.     Move your toes often to avoid stiffness and to lessen swelling.     Raise (elevate) your ankle above the level of your heart while you are sitting or lying down.       General instructions     Record information about your pain. Writing down the following may be helpful for you and your health care provider:     How often you have ankle  pain.     Where the pain is located.     What the pain feels like.     If treatment involves wearing a prescribed shoe or insole, make sure you wear it correctly and for as long as told by your health care provider.     Take over-the-counter and prescription medicines only as told by your health care provider.     Keep all follow-up visits as told by your health care provider. This is important.       Contact a health care provider if:       Your pain gets worse.     Your pain is not relieved with medicines.     You have a fever or chills.     You are having more trouble with walking.     You have new symptoms.     Get help right away if:       Your foot, leg, toes, or ankle:     Tingles or becomes numb.     Becomes swollen.     Turns pale or blue.     Summary       Ankle pain can occur on either side or the back of one ankle or both ankles.     Ankle pain may be sharp and burning or dull and aching.     Rest your ankle as told by your health care provider. If told, apply ice to the area.     Take over-the-counter and prescription medicines only as told by your health care provider.     This information is not intended to replace advice given to you by your health care provider. Make sure you discuss any questions you have with your health care provider.     Document Released: 08/07/2009 Document Revised: 04/12/2020 Document  Reviewed: 04/12/2020     Elsevier Patient Education ? 2023 [employer] Inc[employer]         Heel Spur        A heel spur is a bony growth that forms on the bottom of the heel bone (calcaneus). Heel spurs are common. They often cause inflammation in the plantar fascia, which is the band of tissue that connects the toe bones to the heel bone. When the plantar fascia is inflamed, it is called plantar fasciitis. This may cause pain on the bottom of the foot, near the heel. Many people with plantar fasciitis also have heel spurs. However, spurs are not the cause of plantar fasciitis pain.     What are the causes?    The exact cause of heel spurs is not known. They may be caused by:     Pressure on the heel bone.     Bands of tissues that connect muscle to bone (tendons) pulling on the heel bone.     What increases the risk?    You are more likely to develop this condition if you:     Are older than age 22.     Are overweight.     Have wear-and-tear arthritis (osteoarthritis).     Have plantar fascia inflammation.     Participate in sports or activities that include a lot of running or jumping.     Wear poorly fitted shoes.     What are the signs or symptoms?    Some people have no symptoms. If you do have symptoms, they may include:     Pain in the bottom of your heel.  Pain that is worse when you first get out of bed.     Pain that gets worse after walking or standing.     How is this diagnosed?    This condition may be diagnosed based on:     Your symptoms and medical history.     A physical exam.     A foot X-ray.     How is this treated?    Treatment for this condition depends on how much pain you have. Treatment options may include:     Doing stretching exercises and losing weight, if necessary.     Wearing specific shoes or inserts inside of shoes (orthotics) for comfort and support.     Wearing splints on your feet while you sleep. Splints keep your feet in a position (usually a 90-degree angle) that should prevent and  relieve the pain you feel when you first get out of bed. They also make stretching easier in the morning.     Taking over-the-counter medicine to relieve pain, such as NSAIDs.     Using high-intensity sound waves to break up the heel spur (extracorporeal shock wave therapy).     Getting steroid injections in your heel to reduce inflammation.     Having surgery, if your heel spur causes long-term (chronic) pain.     Follow these instructions at home:      Activity     Avoid activities that cause pain until you recover, or for as long as told by your health care provider.     Do stretching exercises as told. Stretch before exercising or being physically active.     Managing pain, stiffness, and swelling     If directed, put ice on your foot. To do this:     Put ice in a plastic bag.     Place a towel between your skin and the bag.     Leave the ice on for 20 minutes, 2?3 times a day.     Remove the ice if your skin turns bright red. This is very important. If you cannot feel pain, heat, or cold, you have a greater risk of damage to the area.     Move your toes often to reduce stiffness and swelling.     When possible, raise (elevate) your foot above the level of your heart while you are sitting or lying down.     General instructions     Take over-the-counter and prescription medicines only as told by your health care provider.     Wear supportive shoes that fit well. Wear splints, inserts, or orthotics as told by your health care provider.     If recommended, work with your health care provider to lose weight. This can relieve pressure on your foot.    Do not  use any products that contain nicotine or tobacco, such as cigarettes, e-cigarettes, and chewing tobacco. These can delay bone healing. If you need help quitting, ask your health care provider.     Keep all follow-up visits. This is important.       Where to find more information       American Academy of Orthopaedic Surgeons: www.orthoinfo.aaos.org        Contact a health care provider if:       Your pain does not go away with treatment.     Your pain gets worse.     Summary       A heel spur is a bony  growth that forms on the bottom of the heel bone (calcaneus).     Heel spurs often cause inflammation in the plantar fascia, which is the band of tissue that connects the toes to the heel bone. This may cause pain on the bottom of the foot, near the heel.     Doing stretching exercises, losing weight, wearing specific shoes or shoe inserts, wearing splints while you sleep, and taking pain medicine may ease the pain and stiffness.     Other treatment options may include high-intensity sound waves to break up the heel spur, steroid injections, or surgery.     This information is not intended to replace advice given to you by your health care provider. Make sure you discuss any questions you have with your health care provider.     Document Released: 03/26/2005 Document Revised: 06/14/2019 Document Reviewed: 06/14/2019     Elsevier Patient Education ? 2023 Elsevier Inc.

## 2021-09-04 NOTE — ED Notes (Signed)
Air cast applied to L ankle by ED Tech. Paperwork filed and provided to patient. Patient demonstrates how to apply.      Venia Carbon, RN  09/04/21 [phone]

## 2021-09-04 NOTE — ED Provider Notes (Signed)
HPI   Chief Complaint   Patient presents with   . Ankle Pain       8:02 AM  66 y/o male with history of BPH, diabetes mellitus, GERD, hyperlipidemia, hypercholesterolemia, osteoarthritis who presents to the ED today for evaluation of left ankle pain and swelling since yesterday.  Patient states that he recently follow up with Dr. Excell Seltzer for bilateral great toe and foot pain and was told after x-rays that he had some arthritis.  It was recommended to take ibuprofen and/or Tylenol.  He states his feet are feeling better, but now today noted that he was having left ankle swelling and pain.  States it started somewhat yesterday but worsened this morning.  Having difficulty bearing weight secondary to the discomfort.  Did not take anything this morning for pain.  Denies any known trauma.  No known history of gout.  Does not drink alcohol on a regular basis.      History provided by:  Patient  Language interpreter used: No                  Glasgow Coma Scale Score: 15                                  Patient History   Past Medical History:   Diagnosis Date   . Benign prostatic hyperplasia with lower urinary tract symptoms    . Diabetes mellitus (CMS/HCC)    . GERD (gastroesophageal reflux disease)    . Gross hematuria    . Hypercholesterolemia    . Hypertension      Past Surgical History:   Procedure Laterality Date   . CYSTOSCOPY  04/01/2019    BPH and bladder neck tissue   . SKIN CANCER EXCISION  2021   . TONSILLECTOMY     . TRANSURETHRAL RESECTION OF PROSTATE  2018    at [facility]     No family history on file.  Social History     Tobacco Use   . Smoking status: Never   . Smokeless tobacco: Never   Substance Use Topics   . Alcohol use: Not on file   . Drug use: Not on file       Review of Systems   Review of Systems   Constitutional: Negative for chills and fever.   Respiratory: Negative for shortness of breath.    Cardiovascular: Negative for chest pain.   Musculoskeletal: Positive for joint swelling (left ankle pain and  swelling).   Skin: Negative for color change and rash.   Neurological: Negative for numbness.   Psychiatric/Behavioral: Negative.    All other systems reviewed and are negative.      Physical Exam   ED Triage Vitals [09/04/21 0732]   Temp Pulse Resp BP   37.3 C (99.1 F) 89 20 138/80      SpO2 Temp Source Heart Rate Source Patient Position   96 % Oral Monitor Sitting      BP Location FiO2 (%)     Right arm --       Physical Exam  Vitals and nursing note reviewed.   Constitutional:       General: He is not in acute distress.     Appearance: Normal appearance. He is well-developed.   HENT:      Head: Normocephalic and atraumatic.   Eyes:      Extraocular Movements: Extraocular movements intact.  Conjunctiva/sclera: Conjunctivae normal.      Pupils: Pupils are equal, round, and reactive to light.   Musculoskeletal:         General: No swelling.      Comments: Left ankle with mild nonpitting edema, no discoloration, no overlying warmth, erythema.  Mild tenderness to palpation just inferior to the lateral malleolus as well as along the anterior aspect of the ankle.  No tenderness to palpation of the foot.  Dorsal pedis pulse 2+ and symmetrical with the right.  No calf swelling or tenderness.  Full active range of motion of left knee.  Increased pain to the left ankle with dorsiflexion, plantarflexion, inversion and eversion of the ankle.  Able to wiggle all toes.   Skin:     General: Skin is warm and dry.      Capillary Refill: Capillary refill takes less than 2 seconds.   Neurological:      General: No focal deficit present.      Mental Status: He is alert and oriented to person, place, and time.      Sensory: No sensory deficit.   Psychiatric:         Mood and Affect: Mood normal.         Behavior: Behavior normal.       XR ANKLE LEFT 3+ VIEWS   Final Result      Nonspecific diffuse periarticular soft tissue swelling. Correlation with clinical exam findings is recommended.      Lynnea Ferrier, MD 09/04/2021 9:01 AM           Labs Reviewed - No data to display    ED Course & MDM   Diagnoses as of 09/04/21 1449   Calcaneal spur of left foot   Left ankle pain, unspecified chronicity       Medical Decision Making  66 y/o male with history of BPH, diabetes mellitus, GERD, hyperlipidemia, hypercholesterolemia, osteoarthritis who presents to the ED today for evaluation of left ankle pain and swelling since yesterday without injury.    Patient arrives afebrile vital signs within normal limits.  Has no evidence of discoloration, overlying warmth noted to the left ankle.  Possibly some mild nonpitting edema noted to the lateral aspect inferior to the lateral malleolus which is where patient has some tenderness to palpation.  Increased pain with movement of the ankle.  Distal perfusion and sensation to the lower extremity intact.  No calf swelling or tenderness to palpation.    Patient without any known trauma however will get x-ray given known underlying history of osteoarthritis to the foot.  Unlikely gout given that there is no overlying erythema or warmth and only mildly tender to palpation.    Calcaneal spur of left foot: complicated acute illness or injury  Left ankle pain, unspecified chronicity: complicated acute illness or injury  Amount and/or Complexity of Data Reviewed  Radiology: ordered.        9:39 AM  XR ankle shows no acute fracture or dislocation, both plantar and posterior calcaneal spurs noted, mild degenerative changes as interpreted by me.  Reviewed radiology report also.         Lavinia Sharps, [place]  09/04/21 [phone]

## 2021-09-04 NOTE — ED Triage Notes (Signed)
Pt to triage with left ankle pain, started Saturday. Diff walking d/t pain. Slight swelling. Denies injury.

## 2021-09-20 NOTE — Telephone Encounter (Signed)
Called pt and made aware of appt change

## 2021-11-06 ENCOUNTER — Ambulatory Visit: Admit: 2021-11-06 | Discharge: 2021-11-06 | Payer: MEDICARE | Attending: Urology

## 2021-11-06 DIAGNOSIS — N401 Enlarged prostate with lower urinary tract symptoms: Secondary | ICD-10-CM

## 2021-11-06 LAB — POCT BLADDER SCAN

## 2021-11-06 LAB — PSA, POC: PSA, POC: 1.7

## 2021-11-06 NOTE — Progress Notes (Signed)
[facility]  [facility]  [address]  [name] [address]  Dept: [phone]  Dept Fax: [phone]     Patient ID: Tommy Frost is a 66 y.o. male who presents for No chief complaint on file..    Subjective   HPI: The patient has a history of BPH and is status post a TURP in 2018.  He also has a history of an elevated PSA that has been normal lately and a history of ED.  He is here for yearly follow-up. Pt is voiding well: good flow, good control, nocturia x 1; on Avodart; using the sildenafil for ED    Current Outpatient Medications   Medication Instructions   . COVID-19 antigen test (QuickVue At-Home COVID-19 Test) kit TEST AS DIRECTED   . diclofenac (VOLTAREN) 50 mg, 2 times daily   . dutasteride (Avodart) 0.5 mg capsule TAKE 1 CAPSULE BY MOUTH EVERY DAY IN THE MORNING   . gabapentin (NEURONTIN) 300 mg, Daily   . guaiFENesin (MUCINEX) 1,200 mg, 2 times daily   . hydroCHLOROthiazide (HYDRODIURIL) 25 mg, oral, Daily   . lisinopril 40 mg, oral, Daily   . metFORMIN (Glucophage) 500 mg tablet TAKE 2 TABLETS BY MOUTH DAILY WITH DINNER.   Marland Kitchen omeprazole (PriLOSEC) 20 mg DR capsule 1 capsule, oral, Daily   . Ozempic 2 mg, subcutaneous   . pravastatin (Pravachol) 40 mg tablet TAKE 1 TABLET BY MOUTH EVERYDAY AT BEDTIME   . sildenafil (VIAGRA) 100 mg, oral, As needed   . tiZANidine (ZANAFLEX) 2 mg, Nightly PRN     No Known Allergies  Past Medical History:   Diagnosis Date   . Benign prostatic hyperplasia with lower urinary tract symptoms    . Diabetes mellitus (CMS/HCC)    . GERD (gastroesophageal reflux disease)    . Gross hematuria    . History of elevated PSA    . Hypercholesterolemia    . Hypertension    . Obesity      Past Surgical History:   Procedure Laterality Date   . CYSTOSCOPY  04/01/2019    BPH and bladder neck tissue   . SKIN CANCER EXCISION  2021   . TONSILLECTOMY     . TRANSURETHRAL RESECTION OF PROSTATE  2018    at [facility]     Social History     Tobacco  Use   . Smoking status: Never   . Smokeless tobacco: Never   Substance Use Topics   . Alcohol use: Not on file   . Drug use: Not on file       Objective   There were no vitals taken for this visit.   NO UA today; "PVR" 163ccs; PSA 1.7  Physical Exam  Constitutional:       General: He is not in acute distress.     Appearance: Normal appearance. He is obese.   Abdominal:      General: There is no distension.      Palpations: Abdomen is soft.   Genitourinary:     Penis: Normal.       Testes: Normal.      Epididymis:      Right: Normal.      Left: Normal.      Comments: Prostate: smooth, non tender, non nodular, normal  Neurological:      Mental Status: He is alert.         Assessment/Plan   Benign prostatic hyperplasia with lower urinary tract symptoms, symptom details unspecified  He is urinating well he knows to call us if there are any changes or concerns with urinating.  History of elevated PSA  His PSA is low and normal even taking into consideration the dutasteride.  Combined arterial insufficiency and corporo-venous occlusive erectile dysfunction  He is doing well on the sildenafil and will stay on it for now.

## 2021-11-06 NOTE — Progress Notes (Signed)
PVR: Patient's bladder was scanned with a Verathon U/S scanner at least 3 times with .

## 2021-12-05 NOTE — Telephone Encounter (Signed)
Pt will call in a few weeks to schedule u/a drop

## 2021-12-05 NOTE — Telephone Encounter (Signed)
Pt called stating late last night he had some blood in his urine and this morning was clear.  He passed a small blood clot about a half hour ago and has blood in his urine now.  Please advise.  Thanks!

## 2021-12-20 ENCOUNTER — Institutional Professional Consult (permissible substitution): Admit: 2021-12-20 | Discharge: 2021-12-20 | Payer: MEDICARE

## 2021-12-20 LAB — POCT URINALYSIS DIPSTICK
POCT Bilirubin, UA: NEGATIVE
POCT Glucose, UA: NEGATIVE
POCT Ketones, UA: NEGATIVE
POCT Leukocyte Esterase, UA: NEGATIVE
POCT Nitrites, UA: NEGATIVE
POCT Protein, UA: NEGATIVE
POCT Specific Gravity, UA: 1.01 (ref 1.003–1.030)
POCT Urobilinogen, UA: NEGATIVE
POCT pH, UA: 5.5

## 2021-12-20 NOTE — Progress Notes (Signed)
UA to rule out blood in the urine. Dr. Talmage Nap in office to answer any questions or concerns.     POCT Color, UA Yellow     POCT Clarity, UA  Clear Clear     POCT Specific Gravity, UA  1.003 - 1.030 1.010     POCT pH, UA  5.0, 5.5, 6.0, 6.5, 7.0, 7.5, 8.0 5.5  5.5 R    POCT Leukocyte Esterase, UA  Negative Negative     POCT Nitrites, UA  Negative Negative     POCT Protein, UA  Negative, Trace Negative     POCT Glucose, UA  Negative Negative     POCT Ketones, UA  Negative Negative     POCT Urobilinogen, UA  Negative Negative     POCT Bilirubin, UA  Negative Negative     POCT Blood, UA  Negative Trace-intactAbnormal

## 2021-12-20 NOTE — Telephone Encounter (Signed)
POCT Color, UA Yellow     POCT Clarity, UA  Clear Clear     POCT Specific Gravity, UA  1.003 - 1.030 1.010     POCT pH, UA  5.0, 5.5, 6.0, 6.5, 7.0, 7.5, 8.0 5.5  5.5 R    POCT Leukocyte Esterase, UA  Negative Negative     POCT Nitrites, UA  Negative Negative     POCT Protein, UA  Negative, Trace Negative     POCT Glucose, UA  Negative Negative     POCT Ketones, UA  Negative Negative     POCT Urobilinogen, UA  Negative Negative     POCT Bilirubin, UA  Negative Negative     POCT Blood, UA  Negative Trace-intactAbnormal

## 2021-12-20 NOTE — Telephone Encounter (Signed)
Pt came in today to drop off his UA

## 2021-12-21 LAB — URINE MICROSCOPIC ONLY
Bacteria, Ur: NONE SEEN
Casts, Ur: 0 /LPF (ref 0–4)
Epithelial Cells, UR: 1 {cells}/[HPF] (ref 0–5)
RBC, Ur: 0 {cells}/[HPF] (ref 0–4)
WBC, Ur: 0 {cells}/[HPF] (ref 0–5)

## 2021-12-23 NOTE — Telephone Encounter (Signed)
Patient notified. He will call us if this happens again.

## 2022-05-08 IMAGING — MR MRI LUMBAR SPINE WITHOUT CONTRAST
7 of 8 series · 15 of 48 positions shown · IV contrast (gadolinium)
Comparison: None

________________________________________________________________________________________________ 
MRI LUMBAR SPINE WITHOUT CONTRAST, 05/08/2022 [DATE]: 
CLINICAL INDICATION: Low back pain with bilateral leg weakness, left worse than 
right.
TECHNIQUE: Multiplanar, multiecho position MR images of the lumbar spine were 
performed without intravenous gadolinium enhancement. Patient was scanned on a 
1.5T magnet.

[Series 101: survey · axial · 10.0mm · 1.25mm/px · z∈[-33,+201]mm · 2 of 10 slices shown]
[im 1/10]
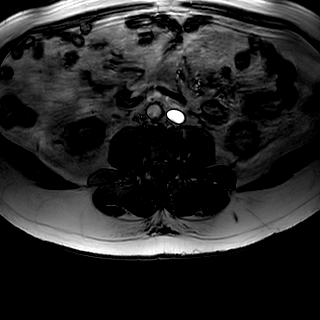
[im 10/10]
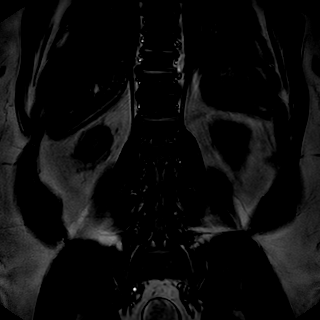

[Series 201: t2w_cor-surv · coronal · 6.0mm · 0.62mm/px · 1 of 10 slices shown]
[im 1/10]
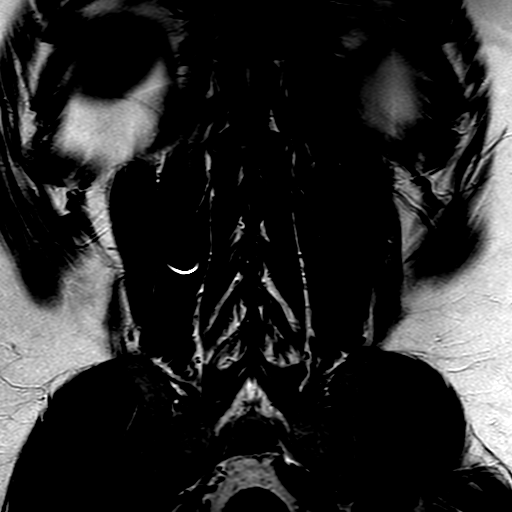

[Series 301: T1 · sagittal · 4.0mm · 0.44mm/px · 2 of 17 slices shown]
[im 1/17]
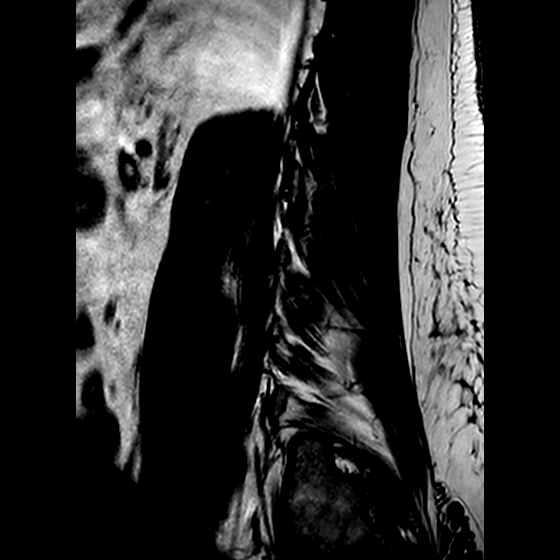
[im 17/17]
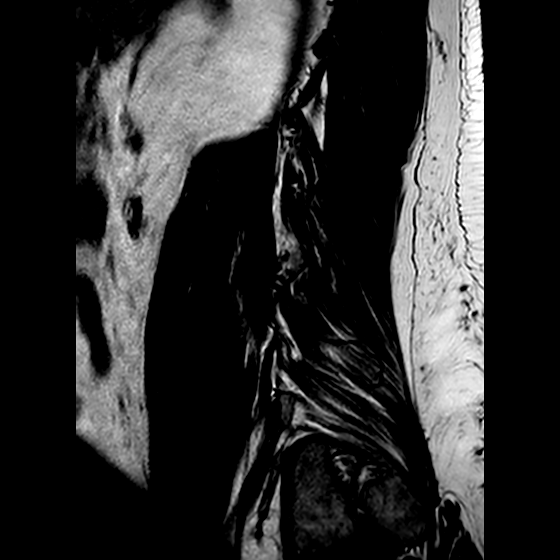

[Series 402: (id)_mdixon_tse · sagittal · 4.0mm · 0.39mm/px · 2 of 17 slices shown]
[im 1/17]
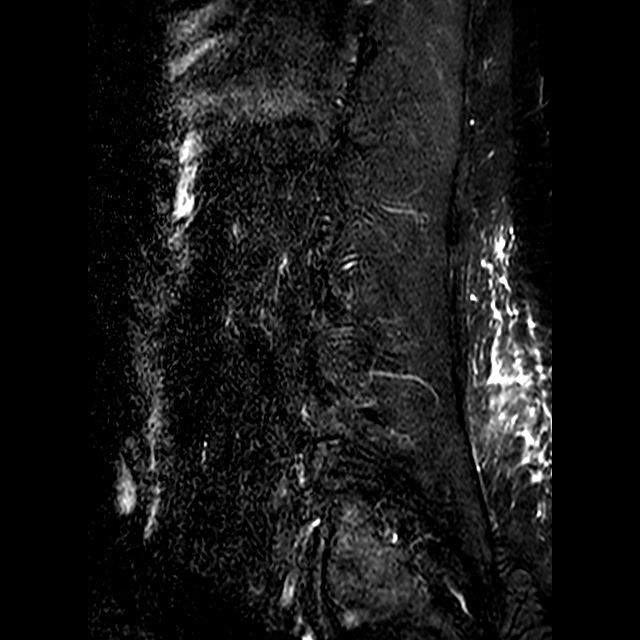
[im 17/17]
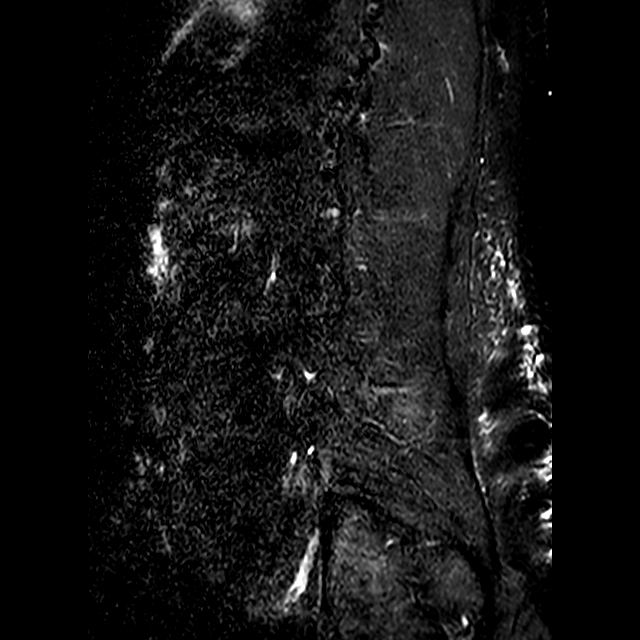

[Series 403: st2w_mdixon_tse · sagittal · 4.0mm · 0.39mm/px · 2 of 17 slices shown]
[im 1/17]
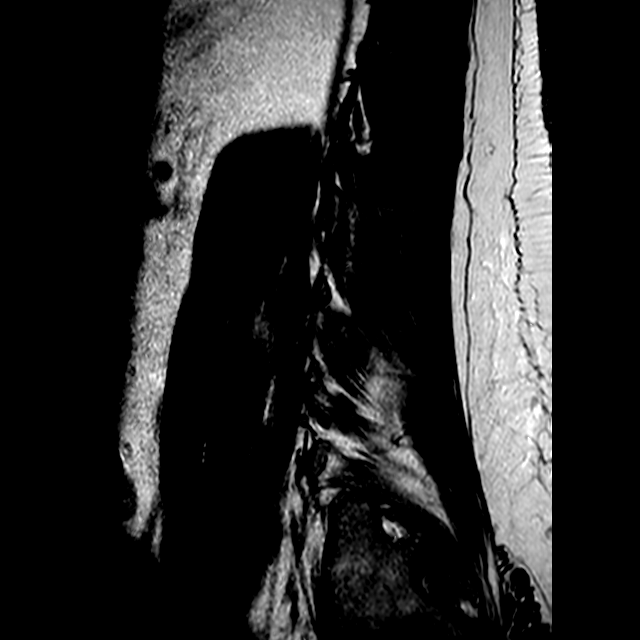
[im 17/17]
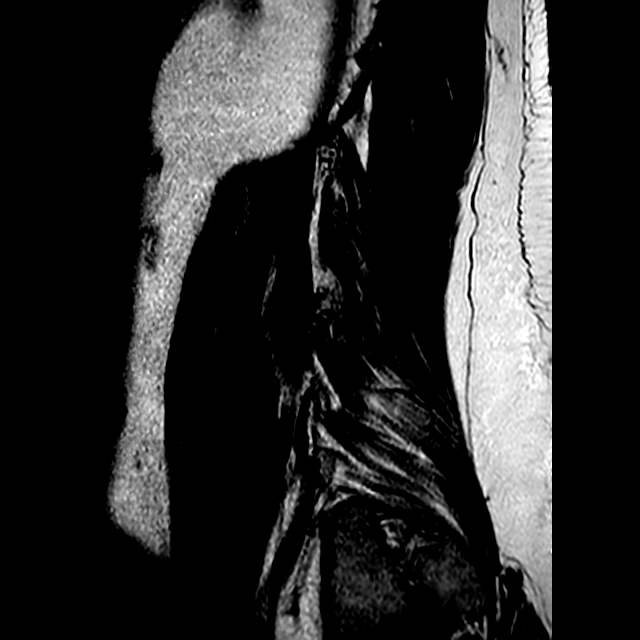

[Series 501: sag spineview ax · sagittal · 1.4mm · 0.36mm/px · 2 of 114 slices shown]
[im 8/114]
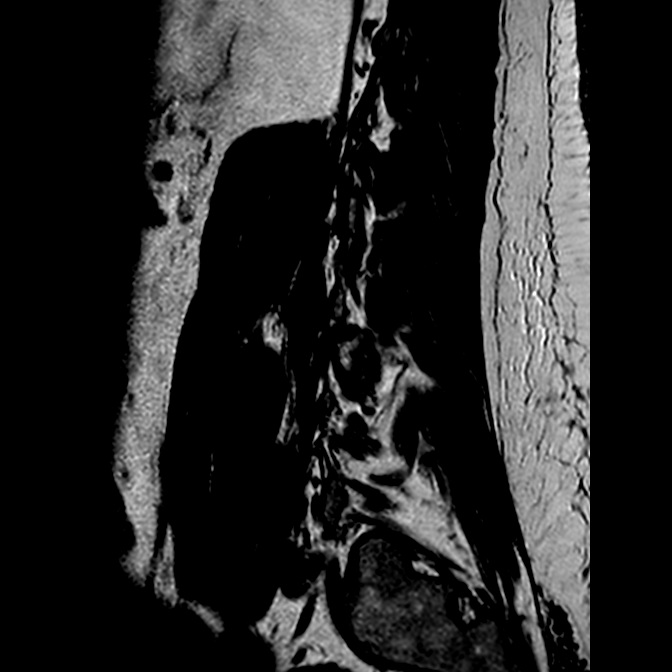
[im 23/114]
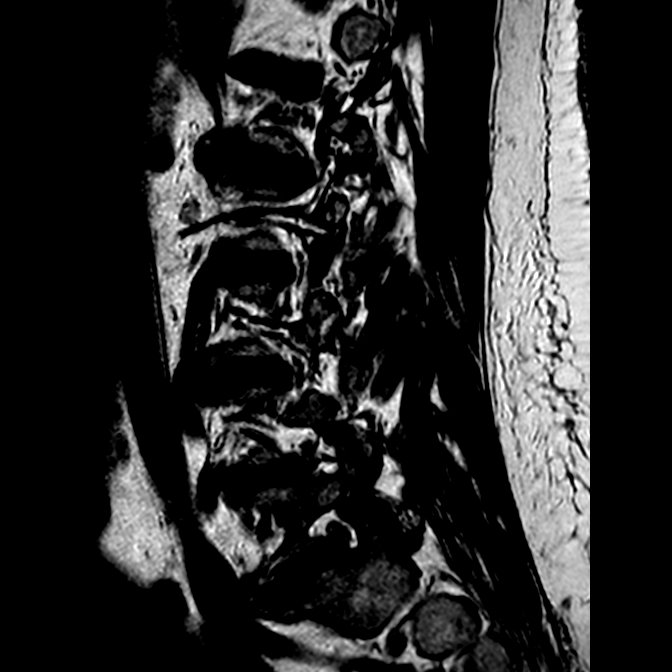

[Series 601: T2 · axial · 4.0mm · 0.30mm/px · z∈[-153,+86]mm · 4 of 30 slices shown]
[im 1/30]
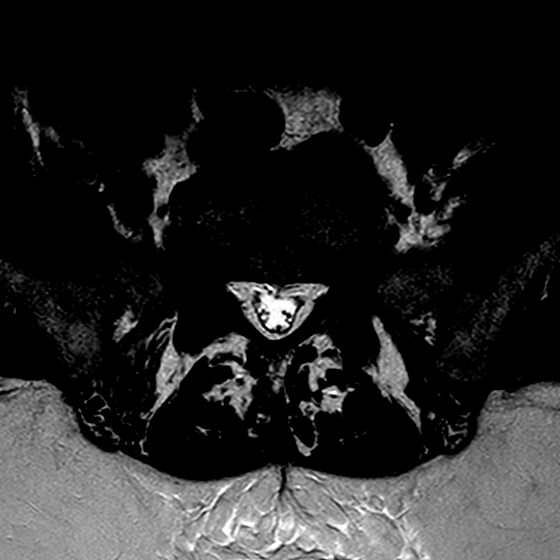
[im 10/30]
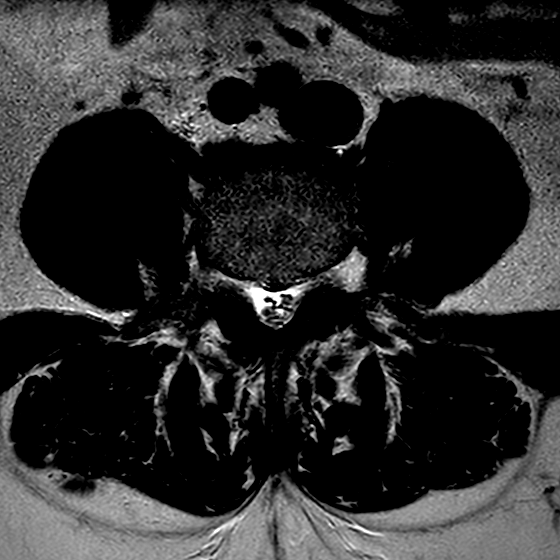
[im 20/30]
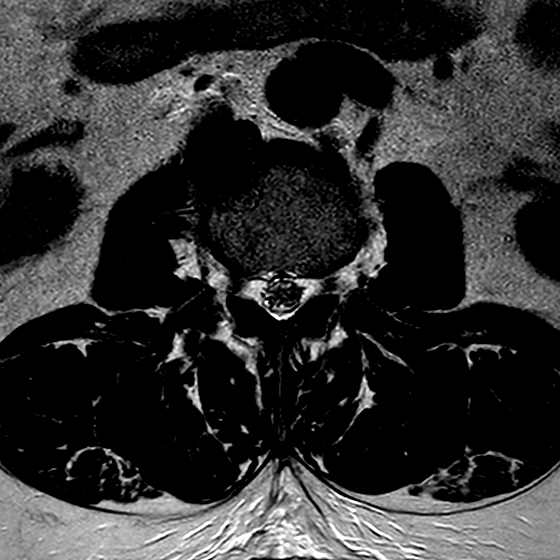
[im 30/30]
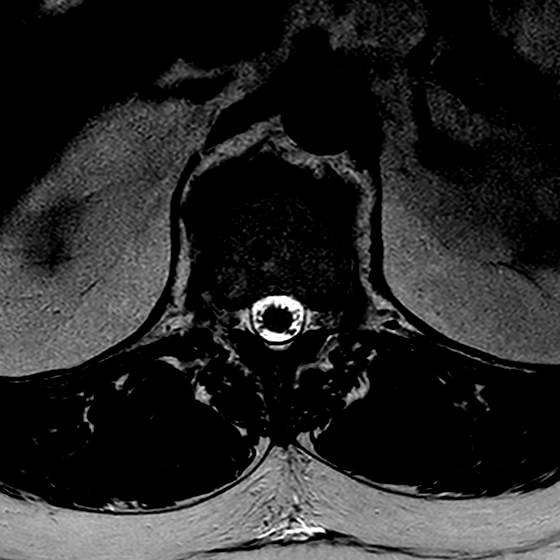

[15 of 48 positions shown; findings below may reference images not displayed]

FINDINGS: -------------------------------------------------------------------------------- 
------ 
GENERAL: 
Nomenclature is based on 5 lumbar type vertebral bodies.     
ALIGNMENT: Normal coronal alignment. Trace grade 1 retrolisthesis L2 on L3 and 
L4 on L5. No pars defect. 
VERTEBRAL BODY HEIGHT: Normal.  
MARROW SIGNAL: No focal suspect signal abnormality. 
CORD SIGNAL: Normal distal spinal cord and cauda equina. Conus medullaris 
terminates at L2. 
ADDITIONAL FINDINGS: Left sided IVC. 
Modic I-II: None. 
Ligamentum Flavum > 2.5 mm: All levels. 
-------------------------------------------------------------------------------- 
------ 
SEGMENTAL: 
Incompletely visualized is left paracentral disc ossified protrusion T11-T12 
level which narrows the left lateral recess. 
T12-L1: Anterior osteophytes. Mild loss of disc height and signal. Small right 
paracentral disc protrusion. Canal and foramina are patent. Normal facets. 
L1-L2: Mild loss of disc height posteriorly. Loss of disc signal. Anterior 
osteophytes. Mild to moderate canal stenosis. Facet arthropathy. 2 mm left facet 
synovial cyst embedded within the left ligamentum flavum. Left foramen patent. 
Mild right foraminal narrowing. 
L2-L3: Mild loss of disc height and signal. Moderately severe canal stenosis. 
Dorsal epidural lipomatosis. Cauda equina compression with lateral recess 
narrowing bilaterally. Mild annular bulge. Facet arthropathy with bilateral 
facet joint effusions. Foramina patent. 
L3-L4: Anterior osteophytes. Mild loss of disc height and signal. Moderate canal 
stenosis with a degree of cauda equina compression. Facet arthropathy and dorsal 
epidural lipomatosis. Foramina patent. 
L4-L5: Mild to moderate loss of disc height. Loss of disc signal. Annular bulge 
with a component of central disc extrusion with disc material extending 
inferiorly. Mild to moderate canal stenosis with lateral recess narrowing 
bilaterally. Facet arthropathy. Mild left and right foraminal narrowing. 
L5-S1: Loss of disc signal. Canal patent. Mild right and mild-to-moderate left 
foraminal narrowing. Facet arthropathy bilaterally. 
-------------------------------------------------------------------------------- 
------
IMPRESSION: Significant multilevel canal stenosis from L1-L2 through L4-L5, detailed above, 
with variable cauda equina compression and lateral recess narrowing. 
Other less significant degenerative changes detailed above. 
Left-sided IVC noted.

## 2022-08-26 LAB — HEMOGLOBIN A1C: HEMOGLOBIN A1C % (INT/EXT): 6.8 % — ABNORMAL HIGH (ref 4.6–5.6)

## 2022-11-11 ENCOUNTER — Ambulatory Visit: Admit: 2022-11-11 | Discharge: 2022-11-11 | Payer: MEDICARE | Attending: Urology

## 2022-11-11 DIAGNOSIS — N401 Enlarged prostate with lower urinary tract symptoms: Secondary | ICD-10-CM

## 2022-11-11 LAB — POCT URINALYSIS DIPSTICK
POCT Bilirubin, UA: NEGATIVE
POCT Glucose, UA: NEGATIVE
POCT Ketones, UA: NEGATIVE
POCT Leukocyte Esterase, UA: NEGATIVE
POCT Nitrites, UA: NEGATIVE
POCT Protein, UA: NEGATIVE
POCT Specific Gravity, UA: 1.02 (ref 1.003–1.030)
POCT Urobilinogen, UA: NEGATIVE
POCT pH, UA: 7

## 2022-11-11 LAB — POCT BLADDER SCAN: PVR, POC: 55

## 2022-11-11 LAB — PSA, POC: PSA, POC: 1.5

## 2022-11-11 MED ORDER — sildenafil (Viagra) 100 mg tablet
100 | ORAL_TABLET | ORAL | 11 refills | 18.00000 days | Status: AC | PRN
Start: 2022-11-11 — End: 2022-12-11

## 2022-11-11 NOTE — Progress Notes (Signed)
[facility]  [facility]  [address]  [name] [address]  Dept: [phone]  Dept Fax: [phone]     Patient ID: Tommy Frost is a 67 y.o. male who presents for No chief complaint on file..    Subjective   HPI The patient has a history of BPH and is status post a TURP in 2018. He also has a history of an elevated PSA that has been normal lately and a history of ED. Pt is voiding well: good flow, good control, nocturia x 1, no dysuria, no nocturia. Still on the Avodart, still using the sildenafil as needed    Current Outpatient Medications   Medication Instructions    COVID-19 antigen test (QuickVue At-Home COVID-19 Test) kit TEST AS DIRECTED    dutasteride (Avodart) 0.5 mg capsule TAKE 1 CAPSULE BY MOUTH EVERY DAY IN THE MORNING    hydroCHLOROthiazide (HYDRODIURIL) 25 mg, oral, Daily    lisinopril 40 mg, oral, Daily    metFORMIN (Glucophage) 500 mg tablet TAKE 2 TABLETS BY MOUTH DAILY WITH DINNER.    omeprazole (PriLOSEC) 20 mg DR capsule 1 capsule, oral, Daily    Ozempic 2 mg, subcutaneous    pravastatin (Pravachol) 40 mg tablet TAKE 1 TABLET BY MOUTH EVERYDAY AT BEDTIME    sildenafil (VIAGRA) 100 mg, oral, As needed     No Known Allergies  Past Medical History:   Diagnosis Date    Benign prostatic hyperplasia with lower urinary tract symptoms     Diabetes mellitus (Multi-HCC)     GERD (gastroesophageal reflux disease)     Gross hematuria     History of elevated PSA     Hypercholesterolemia (CMS-HCC)     Hypertension (CMS-HCC)     Obesity      Past Surgical History:   Procedure Laterality Date    CYSTOSCOPY  04/01/2019    BPH and bladder neck tissue    SKIN CANCER EXCISION  2021    TONSILLECTOMY      TRANSURETHRAL RESECTION OF PROSTATE  2018    at [facility]     No family history on file.  Social History     Tobacco Use    Smoking status: Never    Smokeless tobacco: Never   Substance Use Topics    Alcohol use: None    Drug use: None       Objective   There were  no vitals taken for this visit.  UA; tr. blood PVR: 55ccs  PSA: 1.5  Physical Exam  Constitutional:       General: He is not in acute distress.     Appearance: He is obese.   Abdominal:      General: There is no distension.      Palpations: Abdomen is soft.   Genitourinary:     Penis: Normal.       Testes: Normal.      Epididymis:      Right: Normal.      Left: Normal.      Prostate: Normal. Not enlarged, not tender and no nodules present.      Rectum: Normal.   Neurological:      Mental Status: He is alert.         Assessment/Plan   Benign prostatic hyperplasia with lower urinary tract symptoms, symptom details unspecified  He is voiding well over 6 years out from his TURP and he remains on the Avodart.  He knows to call me if there  is a change or concern with urinating.  Combined arterial insufficiency and corporo-venous occlusive erectile dysfunction  He is using the sildenafil successfully and will continue on it.  History of elevated PSA  His PSA is stable and low even taking into account the dutasteride.  We can check this yearly.

## 2022-11-11 NOTE — Progress Notes (Signed)
PVR: Patient's bladder was scanned with a Verathon U/S scanner at least 3 times with 55mL.

## 2022-11-12 LAB — URINALYSIS
Bilirubin Ur: NEGATIVE
Glucose Ur: NEGATIVE
Ketones Ur: NEGATIVE
Nitrite Ur: NEGATIVE
Occult Blood Ur: NEGATIVE
Protein Ur: NEGATIVE
Specific Gravity Ur: 1.018 (ref 1.005–1.030)
Urobilinogen,Semi-Qn Ur: 0.2 mg/dL (ref 0.2–1.0)
WBC Esterase Ur: NEGATIVE
pH Ur: 6.5 (ref 5.0–7.5)

## 2023-02-04 ENCOUNTER — Ambulatory Visit: Admit: 2023-02-04 | Discharge: 2023-02-04 | Payer: MEDICARE | Attending: Otolaryngology

## 2023-02-04 DIAGNOSIS — H938X1 Other specified disorders of right ear: Secondary | ICD-10-CM

## 2023-02-04 MED ORDER — predniSONE (Deltasone) 20 mg tablet
20 | ORAL_TABLET | ORAL | 0 refills | Status: AC
Start: 2023-02-04 — End: 2023-02-15

## 2023-02-04 NOTE — Progress Notes (Signed)
[facility]  [address]  [name] [address]  [phone]  Dept Fax: [phone]       CHIEF COMPLAINT:   Sinus Problem and Cerumen Impaction     HISTORY OF PRESENT ILLNESS:   Tommy Frost is a 67 y.o. male who presents for ENT consultation for right ear fullness. This happened a few weeks ago when he had rhinorrhea all day.   The rhinorrhea resolved after a few days, but the right ear fullness did not. He then was treated w/ azithromycin about 10 days ago. He still feels the right ear fullness has not resolved.     Past Medical History:  Past Medical History:   Diagnosis Date    Benign prostatic hyperplasia with lower urinary tract symptoms     Diabetes mellitus (Multi-HCC)     GERD (gastroesophageal reflux disease)     Gross hematuria     History of elevated PSA     Hypercholesterolemia (CMS-HCC)     Hypertension (CMS-HCC)     Obesity        Past Surgical History:  Past Surgical History:   Procedure Laterality Date    CYSTOSCOPY  04/01/2019    BPH and bladder neck tissue    SKIN CANCER EXCISION  2021    TONSILLECTOMY      TRANSURETHRAL RESECTION OF PROSTATE  2018    at [facility]       Social History:  Social History     Tobacco Use    Smoking status: Never    Smokeless tobacco: Never   Substance Use Topics    Alcohol use: Not on file     Family History:  No family history on file.    Allergies:  Patient has no known allergies.    Medications:  Current Outpatient Medications   Medication Instructions    COVID-19 antigen test (QuickVue At-Home COVID-19 Test) kit TEST AS DIRECTED    dutasteride (Avodart) 0.5 mg capsule TAKE 1 CAPSULE BY MOUTH EVERY DAY IN THE MORNING    hydroCHLOROthiazide (HYDRODIURIL) 25 mg, oral, Daily    lisinopril 40 mg, oral, Daily    metFORMIN (Glucophage) 500 mg tablet TAKE 2 TABLETS BY MOUTH DAILY WITH DINNER.    omeprazole (PriLOSEC) 20 mg DR capsule 1 capsule, oral, Daily    Ozempic 2 mg, subcutaneous    pravastatin (Pravachol) 40 mg tablet TAKE  1 TABLET BY MOUTH EVERYDAY AT BEDTIME    predniSONE (Deltasone) 20 mg tablet Take 3 tablets (60 mg) by mouth once daily for 7 days, THEN 2 tablets (40 mg) once daily for 2 days, THEN 1 tablet (20 mg) once daily for 2 days.    sildenafil (VIAGRA) 100 mg, oral, As needed       Review of Systems:  Pertinent positives and HEENT review of systems noted in HPI. Otherwise negative review of systems.     PHYSICAL EXAM:  CONSTITUTIONAL: NAD, Awake and Alert, Oriented x3. overweight  FACE: symmetric.  ORBITS: EOMI, PERRL, sclera white and quiet.  RIGHT EAR: Normal auricle, EAC patent, TM clear.  LEFT EAR: Normal auricle, EAC patent, TM clear.  Weber midline  Rinne AC>BC bilaterally  NOSE: Normal external nose. Anterior rhinoscopy by speculum exam, septum midline, no purulent drainage, no polyps.  ORAL CAVITY: tongue normal, occlusion normal, no lesions, no mucosal abnormalities, no trismus  OROPHARYNX: uvula midline, tonsils 1+, no exudates, no erythema.  NECK: soft, thyroid unremarkable, no masses, no LAD, normal ROM.  NEUROLOGIC: CN III-XII intact.    Audio 02/04/23:  bilateral normal sloping to moderate SNHL. Type A tymps AU. WRS 100% AU. SRT 15 dB AU.     ASSESSMENT/PLAN:  Problem List Items Addressed This Visit    None  Visit Diagnoses       Sensation of plugged ear on right side    -  Primary    Relevant Medications    predniSONE (Deltasone) 20 mg tablet    Other Relevant Orders    Auditory function tests (Completed)    Sensorineural hearing loss (SNHL) of right ear with restricted hearing of left ear               Patient has sensation of right ear fullness after nasal congestion a few weeks ago. He did take azithromycin and ear blockage didnt fully resolve so wanted to have the ear checked. On exam today, he has no middle ear effusion. Audiogram is showing bilateral SNHL. The hearing is slightly worse on the right ear compared to the left at 1000 Hz, 2000 Hz, and 4000 Hz by 10 dB. This is minimal, does not quite  qualify as sudden sensorineural hearing loss, but he is quite bothered by the difference in hearing therefore I offered treatment with oral prednisone vs. Transtympanic steroid injections. We discussed risks/benefits of both types of steroid treatment in setting of diabetes (he takes metformin). He would like to try the oral prednisone. I did review that he should take blood glucose measurements daily and to call his PCP if spiking over 300 for risk of diabetic ketoacidosis. He can stop the prednisone at any time if having side effects. He leaves for [facility] Dec 30th. Therefore will plan for f/u audiogram on Dec 18th.     Follow up in about 2 weeks (around 02/18/2023) for with audio first.

## 2023-02-18 ENCOUNTER — Ambulatory Visit: Admit: 2023-02-18 | Discharge: 2023-02-18 | Payer: MEDICARE | Attending: Otolaryngology

## 2023-02-18 DIAGNOSIS — H938X1 Other specified disorders of right ear: Secondary | ICD-10-CM

## 2023-02-18 NOTE — Progress Notes (Signed)
[facility]  [address]  [name] [address]  [phone]  Dept Fax: [phone]       CHIEF COMPLAINT:   Ear Problem and Follow-up     HISTORY OF PRESENT ILLNESS:   Tommy Frost is a 67 y.o. male who presents in f/u for right ear fullness. Last seen 02/04/23. He had slightly asymmetric SNHL. He was treated with oral prednisone course for possible sudden SNHL. Here today for f/u audiogram.   He still feels right ear is blocked.    PRIOR HISTORY;  Ear fullness happened a few weeks ago when he had rhinorrhea all day.   The rhinorrhea resolved after a few days, but the right ear fullness did not. He then was treated w/ azithromycin about 10 days ago. He still feels the right ear fullness has not resolved.     Past Medical History:  Past Medical History:   Diagnosis Date    Benign prostatic hyperplasia with lower urinary tract symptoms     Diabetes mellitus (Multi-HCC)     GERD (gastroesophageal reflux disease)     Gross hematuria     History of elevated PSA     Hypercholesterolemia     Hypertension     Obesity        Past Surgical History:  Past Surgical History:   Procedure Laterality Date    CYSTOSCOPY  04/01/2019    BPH and bladder neck tissue    SKIN CANCER EXCISION  2021    TONSILLECTOMY      TRANSURETHRAL RESECTION OF PROSTATE  2018    at [facility]       Social History:  Social History     Tobacco Use    Smoking status: Never    Smokeless tobacco: Never   Substance Use Topics    Alcohol use: Not on file     Family History:  No family history on file.    Allergies:  Patient has no known allergies.    Medications:  Current Outpatient Medications   Medication Instructions    COVID-19 antigen test (QuickVue At-Home COVID-19 Test) kit TEST AS DIRECTED    dutasteride (Avodart) 0.5 mg capsule TAKE 1 CAPSULE BY MOUTH EVERY DAY IN THE MORNING    hydroCHLOROthiazide (HYDRODIURIL) 25 mg, oral, Daily    lisinopril 40 mg, oral, Daily    metFORMIN (Glucophage) 500 mg tablet TAKE 2  TABLETS BY MOUTH DAILY WITH DINNER.    omeprazole (PriLOSEC) 20 mg DR capsule 1 capsule, oral, Daily    Ozempic 2 mg, subcutaneous    pravastatin (Pravachol) 40 mg tablet TAKE 1 TABLET BY MOUTH EVERYDAY AT BEDTIME    sildenafil (VIAGRA) 100 mg, oral, As needed       Review of Systems:  Pertinent positives and HEENT review of systems noted in HPI. Otherwise negative review of systems.     PHYSICAL EXAM:  CONSTITUTIONAL: NAD, Awake and Alert, Oriented x3. overweight  FACE: symmetric.  ORBITS: EOMI, PERRL, sclera white and quiet.  RIGHT EAR: Normal auricle, EAC patent, TM clear.  LEFT EAR: Normal auricle, EAC patent, TM clear.  NOSE: Normal external nose. Anterior rhinoscopy by speculum exam, septum midline, no purulent drainage, no polyps.  ORAL CAVITY: tongue normal, occlusion normal, no lesions, no mucosal abnormalities, no trismus  OROPHARYNX: uvula midline, tonsils 1+, no exudates, no erythema.  NECK: soft, thyroid unremarkable, no masses, no LAD, normal ROM.  NEUROLOGIC: CN III-XII intact. Facial nerve fully intact bilaterally.  Audio 02/04/23:  bilateral normal sloping to moderate SNHL. Type A tymps AU. WRS 100% AU. SRT 15 dB AU.     Audio 02/18/23:  left normal sloping to moderate SNHL. Right normal sloping to moderate SNHL. WRS 100% AU. SRT 25 dB AD, 30 dB AS.     ASSESSMENT/PLAN:  Problem List Items Addressed This Visit    None  Visit Diagnoses       Sensation of plugged ear on right side    -  Primary    Sensorineural hearing loss (SNHL) of both ears              Patient has sensation of right ear fullness after nasal congestion a few weeks ago. Audiogram again demonstrates right SNHL compared to the left, stable even after course of Prednisone. I offered MRI IACs to check for retrocochlear pathology, but he is leaving for [place] soon so we will reevaluate the hearing in about 6 months and will discuss option of MRI again at that time. For now he will give it time, and will see if any hearing recovers  over the next 6 months. If it does not recover or if it is worsening, would consider hearing aids and MRI IACs.     Follow up in about 6 months (around 08/19/2023) for with audio first.

## 2023-08-31 LAB — HEMOGLOBIN A1C: HEMOGLOBIN A1C % (INT/EXT): 6.8 % — ABNORMAL HIGH (ref 4.6–5.6)

## 2023-11-03 ENCOUNTER — Institutional Professional Consult (permissible substitution): Admit: 2023-11-03 | Discharge: 2023-11-03 | Payer: MEDICARE

## 2023-11-03 DIAGNOSIS — C44310 Basal cell carcinoma of skin of unspecified parts of face: Principal | ICD-10-CM

## 2023-11-03 NOTE — Progress Notes (Signed)
 Subjective:     Chief Complaint   Patient presents with    Consult     BCC right preauricular        HPI  68 year old gentleman referred by Dr. 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  These healed well.    Patient states his current lesion has been present for about a year.  It was a nonhealing scaly patch of skin.  No itching.  No pain.  No bleeding.  No prior treatment of the area.  He had a biopsy done on 10/05/2023 which showed this to be a nodular basal cell carcinoma.    Patient is a non-insulin-dependent diabetic.  He is on metformin.  He states he has been started on Ozempic which she has been on for 2 months.  His last A1c on 08/31/2023 was 6.8.    Medical History[1]   Surgical History[2]   Current Medications[3]  Allergies[4]   Social History     Tobacco Use    Smoking status: Never    Smokeless tobacco: Never   Substance Use Topics    Alcohol use: Not on file      Family History[5]     Review of Systems  14 pt ROS neg except as per HPI.  Constitutional: Negative. Negative for activity change, appetite change, chills, diaphoresis, fatigue and fever.   HENT: Negative for congestion, dental problem, ear pain, mouth sores, nosebleeds, sinus pressure and trouble swallowing.   Eyes: Negative for discharge, redness and itching.   Respiratory: Negative for apnea, cough, chest tightness and shortness of breath.   Cardiovascular: Negative for chest pain and leg swelling.   Gastrointestinal: Negative for abdominal distention, abdominal pain, constipation, diarrhea, nausea and rectal pain.   Endocrine: Negative for cold intolerance and heat intolerance.   Genitourinary: Negative for dysuria, genital sores.   Musculoskeletal: Negative for arthralgias, back pain and myalgias.   Skin: Negative for color change and rash.    Neurological: Negative for dizziness, tremors, syncope, weakness and headaches.   Psychiatric/Behavioral: Negative for agitation, sleep disturbance and suicidal ideas.        Objective:     Physical Exam  Physical Exam  Constitutional:       General: He is not in acute distress.     Appearance: Normal appearance.   HENT:      Head: Normocephalic and atraumatic.     Skin:     General: Skin is warm and dry.      Coloration: Skin is not pale.      Findings: No erythema or rash.   Neurological:      General: No focal deficit present.      Mental Status: He is alert.         Review Dr. 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  Discussed diagnosis and options for treatment.   Reviewed options for treatment including an excision with linear closure here in the office versus a Mohs excision and closure with me.  Discussed with him the difference of the 2.  Reviewed the Mohs procedure.  Explained that without the Mohs procedure there is a 5 to 10% chance that we could still have a positive margin following my excision requiring  more surgery.  Patient is comfortable proceeding with a direct excision here in the office.  Reviewed the procedure including risks, scarring and recovery.    The risks, alternatives and benefits of the procedure have been discussed at length with the patient with the risks being but not limited to bleeding,  poor wound healing, hypertrophic scarring, open wounds, change in sensation at surgical site, recurrence of lesion, infection, unacceptable cosmetic outcome, injury to vital structures and need for further surgery.  The patient understands these risks and wishes to proceed with the surgery.  ?  ?Patient scheduled for procedure 12/31/2023.    Greater than 50% of the time spent (30 min) was devoted to counseling and coordinating care including review of records, pertinent lab data and studies, as well as discussing  diagnostic evaluation and work up, planned therapeutic interventions and future disposition of care.  This includes any additional research needed to obtain further information in formulating the plan of care of this patient. This includes counseling the patient about her disease and diagnosis.          [1]   Past Medical History:  Diagnosis Date    Benign prostatic hyperplasia with lower urinary tract symptoms     Diabetes mellitus (Multi-HCC)     GERD (gastroesophageal reflux disease)     Gross hematuria     History of elevated PSA     Hypercholesterolemia      Hypertension      Obesity    [2]   Past Surgical History:  Procedure Laterality Date    CYSTOSCOPY  04/01/2019    BPH and bladder neck tissue    SKIN CANCER EXCISION  2021    TONSILLECTOMY      TRANSURETHRAL RESECTION OF PROSTATE  2018    at [facility]   [3]   Current Outpatient Medications:     COVID-19 antigen test (QuickVue At-Home COVID-19 Test) kit, TEST AS DIRECTED, Disp: 8 kit, Rfl: 0    dutasteride (Avodart) 0.5 mg capsule, TAKE 1 CAPSULE BY MOUTH EVERY DAY IN THE MORNING, Disp: 90 capsule, Rfl: 1    hydroCHLOROthiazide (HYDRODiuril) 25 mg tablet, Take 25 mg by mouth in the morning., Disp: , Rfl:     lisinopril 40 mg tablet, Take 40 mg by mouth in the morning., Disp: , Rfl:     metFORMIN (Glucophage) 500 mg tablet, TAKE 2 TABLETS BY MOUTH DAILY WITH DINNER., Disp: , Rfl:     Mounjaro 5 mg/0.5 mL pen injector, 5 mg., Disp: , Rfl:     omeprazole (PriLOSEC) 20 mg DR capsule, Take 1 capsule by mouth in the morning., Disp: , Rfl:     pravastatin (Pravachol) 40 mg tablet, TAKE 1 TABLET BY MOUTH EVERYDAY AT BEDTIME, Disp: , Rfl:     semaglutide (Ozempic) 2 mg/dose (8 mg/3 mL) pen-injector, Inject 2 mg under the skin., Disp: , Rfl:     sildenafil  (Viagra ) 100 mg tablet, Take 1 tablet (100 mg) by mouth if needed for erectile dysfunction., Disp: 30 tablet, Rfl: 11  [4] No Known Allergies  [5] No family history on file.

## 2023-11-10 ENCOUNTER — Ambulatory Visit: Admit: 2023-11-10 | Discharge: 2023-11-10 | Payer: MEDICARE | Attending: Urology

## 2023-11-10 DIAGNOSIS — N401 Enlarged prostate with lower urinary tract symptoms: Principal | ICD-10-CM

## 2023-11-10 LAB — POCT URINALYSIS DIPSTICK
POCT Bilirubin, UA: NEGATIVE
POCT Glucose, UA: NEGATIVE
POCT Ketones, UA: NEGATIVE
POCT Leukocyte Esterase, UA: NEGATIVE
POCT Nitrites, UA: NEGATIVE
POCT Protein, UA: NEGATIVE
POCT Specific Gravity, UA: 1.015 (ref 1.003–1.030)
POCT Urobilinogen, UA: NEGATIVE
POCT pH, UA: 5.5

## 2023-11-10 LAB — PSA, POC: PSA, POC: 2.2 (ref 0–4)

## 2023-11-10 LAB — POCT BLADDER SCAN

## 2023-11-10 NOTE — Progress Notes (Signed)
 PVR: Patient's bladder was scanned with a Verathon U/S scanner at least 3 times with 168cc

## 2023-11-10 NOTE — Progress Notes (Signed)
 [facility]  [address]  [name] [address]  Dept: [phone]  Dept Fax: [phone]     Patient ID: Tommy Frost is a 68 y.o. male who presents for No chief complaint on file..    Subjective  HPI The patient has a history of BPH and is status post a TURP in 2018. He also has a history of an elevated PSA that has been normal lately and a history of ED. Pt is voiding well: so good flow, good control, nocturia x 2, a little weaker stream in AM: Sildenafil  still working, using it once in a while. Still on the Avodart    Current Outpatient Medications   Medication Instructions    COVID-19 antigen test (QuickVue At-Home COVID-19 Test) kit TEST AS DIRECTED    dutasteride (AVODART) 0.5 mg, oral, Every morning    hydroCHLOROthiazide (HYDRODIURIL) 25 mg, Daily    lisinopril 40 mg, Daily    metFORMIN (Glucophage) 500 mg tablet TAKE 2 TABLETS BY MOUTH DAILY WITH DINNER.    Mounjaro 5 mg    omeprazole (PriLOSEC) 20 mg DR capsule 1 capsule, Daily    Ozempic 2 mg, subcutaneous    pravastatin (Pravachol) 40 mg tablet TAKE 1 TABLET BY MOUTH EVERYDAY AT BEDTIME    sildenafil  (VIAGRA ) 100 mg, oral, As needed     Allergies[1]  Medical History[2]  Surgical History[3]    Objective  There were no vitals taken for this visit.  UA: tr. Blood on dip PVR: 168cc  PSA 2.2  Physical Exam  Constitutional:       General: He is not in acute distress.     Appearance: He is obese.   Abdominal:      General: There is no distension.      Palpations: Abdomen is soft.   Genitourinary:     Penis: Normal.       Testes: Normal.      Epididymis:      Right: Normal.      Left: Normal.      Rectum: Normal.      Comments: Prostate: smooth, non nodular, non tender, not enlarged  Neurological:      Mental Status: He is alert.         Assessment / Plan  Benign prostatic hyperplasia with lower urinary tract symptoms, symptom details unspecified  He is generally urinating very well I explained to him why sometimes things are  little slower in the morning and he understands that.  He did have a moderate PVR today but he said he did not empty his bladder after he filled the cup I will take him at his word because he is urinating well.    Elevated PSA  His PSA today was 2.2 and even if you doubled for the Avodart it becomes 4.4.  His highest PSA 10 years ago was 5.6 so I am not too concerned about this at this time we can continue to check it yearly.    ED  He still uses the sildenafil  successfully we will continue to use it at this time.                                [1] No Known Allergies  [2]   Past Medical History:  Diagnosis Date    Benign prostatic hyperplasia with lower urinary tract symptoms     Diabetes mellitus (Multi-HCC)     GERD (gastroesophageal  reflux disease)     Gross hematuria     History of elevated PSA     Hypercholesterolemia      Hypertension      Obesity    [3]   Past Surgical History:  Procedure Laterality Date    CYSTOSCOPY  04/01/2019    BPH and bladder neck tissue    SKIN CANCER EXCISION  2021    TONSILLECTOMY      TRANSURETHRAL RESECTION OF PROSTATE  2018    at [facility]

## 2023-11-11 LAB — URINALYSIS
Bilirubin Ur: NEGATIVE
Glucose Ur: NEGATIVE
Ketones Ur: NEGATIVE
Nitrite Ur: NEGATIVE
Occult Blood Ur: NEGATIVE
Specific Gravity Ur: 1.019 (ref 1.005–1.030)
Urobilinogen,Semi-Qn Ur: 0.2 mg/dL (ref 0.2–1.0)
WBC Esterase Ur: NEGATIVE
pH Ur: 5.5 (ref 5.0–7.5)

## 2023-11-17 ENCOUNTER — Encounter: Payer: MEDICARE | Attending: Urology

## 2023-11-19 NOTE — Telephone Encounter (Signed)
 LVM pre procedure review. To avoid ASA/ibuprofen and fish oil prior to her 10/2 procedure. To call the office with any questions or concerns,

## 2023-12-17 NOTE — Telephone Encounter (Signed)
 LVM Pre procedure review. To avoid ASA/ibuprofen and fish oil prior to his 10/30 procedure. To call the office with any questions or concerns.

## 2023-12-31 ENCOUNTER — Ambulatory Visit: Admit: 2023-12-31 | Discharge: 2023-12-31 | Payer: MEDICARE

## 2023-12-31 DIAGNOSIS — C44319 Basal cell carcinoma of skin of other parts of face: Principal | ICD-10-CM

## 2023-12-31 NOTE — Progress Notes (Signed)
 Subject Line: Excision    Patient's BP was elevated when he first arrived in exam room 190/76. He mentioned that he may have white coat syndrome and also bilateral Achilles pain. BP was obtained twice more, patient was sitting, and left and right arm readings. 162/80 and 160/88. He states that he did take his Lisinopril 40 mg this morning. He also mentioned that he was seen at the doctors a few weeks ago, his BP was normal. He has a BP machine at home and he will monitor.       Pre-operative Diagnosis:  Basal cell carcinoma    Post-operative Diagnosis: Same    Location:  right preauricular cheek     Excision length:  0.9 cm    Length of closure: 2.6 cm    Anesthesia:  4 cc of 1% Xylocaine and 1:100,000 Epinephrine    Indications for surgery: malignancy     Surgeon: Maryellen Lash, MD        Assistant:  Laverda Chow, LPN    Procedure Details:   Patient informed of risks (pain, permanent scarring, infection, light or dark discoloration, bleeding, infection, weakness, numbness and recurrence of the lesion) and benefits of the procedures and written informed consent obtained.     The patient was brought to the procedure room, prepped and draped in the usual manner.  The site was confirmed by the patient and marked with ink. A fusiform shape was drawn around the lesion with a sterile marking pen with a 2 mm margin. The area was infiltrated with 1% Xylocaine and 1:100,000 Epinephrine.  The margins were incised as drawn to the level of the  subcutaneous fat with a #15 blade. The tissue was removed with sharp dissection.  Hemostasis was  maintained .    Closure Type:     INTERMEDIATE LINEAR CLOSURE:     The lateral margins of the resulting defect were undermined at the level of the subcutaneous fat with blunt and sharp dissection. The wound was closed with deep buried subcutaneous sutures of 4-0 monocryl sutures. The wound edges were re-approximated with 5-0 nylon sutures.       A sterile dressing was applied, and full  wound care instructions were given to the patient. The patient left the room in good condition.      Suture removal: 7 days     Clearance: No    Aspirin: No    Plavix: No    Coumadin: No    Fish oil/Omega 3: No    Other blood thinner: No    Pre-op Antibiotics today: No     Antibiotics for dental work: No     Pacemaker/Defibrillator: No

## 2023-12-31 NOTE — Progress Notes (Unsigned)
 Subjective:     Chief Complaint   Patient presents with    Procedure     Excision BCC right preauricular cheek        HPI  Patient's BP was elevated when he first arrived in exam room 190/76. He mentioned that he may have white coat syndrome and also bilateral Achilles pain. BP was obtained twice more, patient was sitting, and left and right arm readings. 162/80 and 160/88. He states that he did take his Lisinopril 40 mg this morning. He also mentioned that he was seen at the doctors a few weeks ago, his BP was normal. He has a BP machine at home and he will monitor.     Medical History[1]   Surgical History[2]   Current Medications[3]  Allergies[4]   Social History     Tobacco Use    Smoking status: Never    Smokeless tobacco: Never   Substance Use Topics    Alcohol use: Not on file      Family History[5]     Review of Systems  14 pt ROS neg except as per HPI.  Constitutional: Negative. Negative for activity change, appetite change, chills, diaphoresis, fatigue and fever.   HENT: Negative for congestion, dental problem, ear pain, mouth sores, nosebleeds, sinus pressure and trouble swallowing.   Eyes: Negative for discharge, redness and itching.   Respiratory: Negative for apnea, cough, chest tightness and shortness of breath.   Cardiovascular: Negative for chest pain and leg swelling.   Gastrointestinal: Negative for abdominal distention, abdominal pain, constipation, diarrhea, nausea and rectal pain.   Endocrine: Negative for cold intolerance and heat intolerance.   Genitourinary: Negative for dysuria, genital sores.   Musculoskeletal: Negative for arthralgias, back pain and myalgias.   Skin: Negative for color change and rash.   Neurological: Negative for dizziness, tremors, syncope, weakness and headaches.   Psychiatric/Behavioral: Negative for agitation, sleep disturbance and suicidal ideas.        Objective:     Physical Exam  Physical Exam    All labs and or tests reviewed    Assessment/Plan:            The risks, alternatives and benefits of the procedure have been discussed at length with the patient with the risks being but not limited to bleeding,  poor wound healing, hypertrophic scarring, open wounds, change in sensation at surgical site, recurrence of lesion, infection, unacceptable cosmetic outcome, injury to vital structures and need for further surgery.  The patient understands these risks and wishes to proceed with the surgery.  ?  Special note is made of the patient's existing medical issues which will add to the complexity of the excision, reconstruction, and healing.  ?  Patient advised to obtain ride home  Patient advised to avoid exposure to tobacco, noting adverse effect on wound healing.  If patient is smoker, or exposed to second hand smoke, handout given to patient about side effects of tobacco, and tips to stop smoking.?  ?  Greater than 50% of the time spent {Blank single:19197::(15 min),(20 min),(25 min),(30 min),(35 min),(40 min),(45 min),(50 min),(55 min),(60 min)} was devoted to counseling and coordinating care including review of records, pertinent lab data and studies, as well as discussing diagnostic evaluation and work up, planned therapeutic interventions and future disposition of care.  This includes any additional research needed to obtain further information in formulating the plan of care of this patient. This includes counseling the patient about her disease and diagnosis.        [  1]   Past Medical History:  Diagnosis Date    Benign prostatic hyperplasia with lower urinary tract symptoms     Diabetes mellitus     GERD (gastroesophageal reflux disease)     Gross hematuria     History of elevated PSA     Hypercholesterolemia     Hypertension     Obesity    [2]   Past Surgical History:  Procedure Laterality Date    CYSTOSCOPY  04/01/2019    BPH and bladder neck tissue    SKIN CANCER EXCISION  2021    TONSILLECTOMY      TRANSURETHRAL RESECTION OF  PROSTATE  2018    at [facility]   [3]   Current Outpatient Medications:     COVID-19 antigen test (QuickVue At-Home COVID-19 Test) kit, TEST AS DIRECTED, Disp: 8 kit, Rfl: 0    dutasteride (Avodart) 0.5 mg capsule, TAKE 1 CAPSULE BY MOUTH EVERY DAY IN THE MORNING, Disp: 90 capsule, Rfl: 1    hydroCHLOROthiazide (HYDRODiuril) 25 mg tablet, Take 25 mg by mouth in the morning., Disp: , Rfl:     lisinopril 40 mg tablet, Take 40 mg by mouth in the morning., Disp: , Rfl:     metFORMIN (Glucophage) 500 mg tablet, TAKE 2 TABLETS BY MOUTH DAILY WITH DINNER., Disp: , Rfl:     Mounjaro 5 mg/0.5 mL pen injector, 5 mg., Disp: , Rfl:     omeprazole (PriLOSEC) 20 mg DR capsule, Take 1 capsule by mouth in the morning., Disp: , Rfl:     pravastatin (Pravachol) 40 mg tablet, TAKE 1 TABLET BY MOUTH EVERYDAY AT BEDTIME, Disp: , Rfl:     semaglutide (Ozempic) 2 mg/dose (8 mg/3 mL) pen-injector, Inject 2 mg under the skin., Disp: , Rfl:     sildenafil  (Viagra ) 100 mg tablet, Take 1 tablet (100 mg) by mouth if needed for erectile dysfunction., Disp: 30 tablet, Rfl: 11  [4] No Known Allergies  [5] No family history on file.

## 2023-12-31 NOTE — Patient Instructions (Signed)
 [facility]  [address]  [place], [name] [address]  Phone #: [phone]  Fax #: [phone]       POST-OPERATIVE WOUND CARE INSTRUCTIONS    Often the surgical incision might be different (longer or a different shape or going a different direction) from what was initially described by your physician.  This change occurs based on the final size of the tumor defect and the unique characteristics of each patient.    Items Needed for Wound Care    Aquaphor or Vaseline  Mupirocin (only if prescribed)  Non-stick pad and medical tape/paper tape or Band-Aids   Q-Tips  Sterile gauze    Wound Care Instructions    The bandage placed at the time of surgery acts as a pressure dressing to prevent bleeding post operatively.  It should be kept dry and intact for 24 hours     You may bathe or shower at any time after surgery as long as bandage is kept dry for the amount of time indicated above.    Before and after  performing wound care , wash hands with soap and water       Remove the initial bandage , gently bot the wound with clean gauze and warm tap water.  Then blot wound site dry with clean gauze.     Let wound air dry for 10 minutes.     Using a Q-tip, Apply Aquaphor or Vaseline  to keep the wound moist. Cover with non-stick pad/Band-Aid/gauze.  It is not necessary to cover the wound if you will be staying indoors as long as ointment has been applied              Always cover surgical site with a non-stick pad/Band-Aid/gauze at bedtime or if it is in an area that rubs against your clothing.      Repeat wound care every 24 hours until the area appears healed (no scabbing, crusting or drainage) unless other wise directed by the doctor OR  until you return for a suture removal/follow up.        NEVER APPLY HYDROGEN PEROXIDE OR RUBBING ALCOHOL TO THE WOUND SITE.    General Instructions  Go home and take it easy for at least 24 hours.  Avoid stretching the surgery site for 1-2 weeks following the  surgery.  Adding tension to the area may cause pain, bleeding, and/or dehiscence (opening of wound).  Persistent tension on the surgery site may impair healing and lead to a wider or thicker scar (keloid).    It is best to avoid exercise and strenuous activity for the first week.    Specific instructions are listed below:   Swelling and bruising after surgery may occur.  To help minimize swelling and bruising, keep head or surgery site elevated using 1-2 pillows.  Place an old clean towel over the pillows in case bleeding should occur.  It is also recommended to sleep on the unaffected side of surgery if possible.  Please note it is common for there to be swelling and/or bruising around the eyes if the surgery was on the face or forehead.   An ice pack (place in plastic bag so bandage does not get wet) can be applied 20 minutes on/20 minutes off throughout the day     It is recommended to avoid tobacco products and/or second smoke.    To promote better healing, it is STRONGLY RECOMMENDED that you make every attempt to quit smoking.  Avoid  alcohol/beer for 48 hours after surgery as it can increase the chance for bleeding.    Discomfort    Most patients experience little or no discomfort.  You make take Ibuprofen,Tylenol /Extra Strength Tylenol  (acetaminophen ) for pain control for 2 weeks following surgery.  Do not take more than 3000mg  of tylenol  in 24 hours.     If your discomfort persists or becomes unmanageable, please do not hesitate to call our office for further advice     If you are taking Aspirin daily prescribed by a physician, you may continue to taking your normal dose.     Bleeding    If bleeding should occur, apply continuous pressure with an ice pack on the area for 30 minutes without removing the bandage. Should bleeding persist, please call the office immediately.    Infection    Infection is not common when the wound is properly cared for.  If your wound becomes red, warm, painful or begins to drain,  or if you develop a fever of 101 degrees or greater please give the office a call.        >  Other special instructions: keep head elevated        Nurse/[place] Signature: Laverda Chow Date: 12/31/23      For urgent wound, infection, or discomfort concerns after hours or during the weekend, please call and follow prompts:   [phone]

## 2024-01-06 NOTE — Telephone Encounter (Signed)
 Call patient to discuss results of excision from 12/31/2023.  I discussed with him that all the margins were clear and no further surgery needed.  He states everything is healing well.  He is to return tomorrow for sutures with my nursing staff.  Told him that I typically do not schedule a follow-up with me unless there is any questions or concerns with the way the area is healing.  He should continue with his routine follow-up with Dr. Almyra.

## 2024-01-07 ENCOUNTER — Ambulatory Visit: Admit: 2024-01-07 | Discharge: 2024-01-07 | Payer: MEDICARE

## 2024-01-07 DIAGNOSIS — L905 Scar conditions and fibrosis of skin: Principal | ICD-10-CM

## 2024-01-07 NOTE — Progress Notes (Signed)
 Subject Line: Follow-up     [facility]  [address]  [place], [name] [address]  Phone #: [phone]  Fax #: [phone]      Subjective:    Feeling well. No concerns.  Patient ID: Tommy Frost is a 67 y.o. male.  Chief Complaint   Patient presents with    Suture / Staple Removal     12/31/23 Excision BCC, right preauricular cheek       HPI:         Objective:   Physical Exam  Constitutional:He is oriented to person, place, and time. He appears well-developed and well-nourished. No distress.   Neurological: He is alert and oriented to person, place, and time.   Skin: Skin is warm. He is not diaphoretic. No pallor.   Psychiatric: He has a normal mood and affect. His behavior is normal.   Surgical site: Healing within normal limits, no signs of infection noted.       Assessment/Plan:        Incision healing well. Will continue with Aquaphor. Sun prevention reviewed. To call if any concerns.  Problem List Items Addressed This Visit     No problem-specific Assessment & Plan notes found for this encounter.
